# Patient Record
Sex: Female | Born: 1985 | Race: White | Hispanic: No | Marital: Married | Smoking: Former smoker
Health system: Southern US, Community
[De-identification: ages and names within clinical notes are randomized; demographics above are authoritative.]

## PROBLEM LIST (undated history)

## (undated) DIAGNOSIS — D649 Anemia, unspecified: Secondary | ICD-10-CM

## (undated) DIAGNOSIS — E0590 Thyrotoxicosis, unspecified without thyrotoxic crisis or storm: Secondary | ICD-10-CM

## (undated) HISTORY — PX: COLONOSCOPY: SHX174

---
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
## 2004-05-20 ENCOUNTER — Ambulatory Visit: Payer: Self-pay | Admitting: Family Medicine

## 2004-08-16 ENCOUNTER — Ambulatory Visit: Payer: Self-pay | Admitting: Family Medicine

## 2005-01-15 ENCOUNTER — Other Ambulatory Visit: Admission: RE | Admit: 2005-01-15 | Discharge: 2005-01-15 | Payer: Self-pay | Admitting: Obstetrics and Gynecology

## 2006-07-16 ENCOUNTER — Encounter: Admission: RE | Admit: 2006-07-16 | Discharge: 2006-07-16 | Payer: Self-pay | Admitting: Endocrinology

## 2006-07-28 ENCOUNTER — Encounter: Admission: RE | Admit: 2006-07-28 | Discharge: 2006-07-28 | Payer: Self-pay | Admitting: Endocrinology

## 2006-08-12 ENCOUNTER — Other Ambulatory Visit: Admission: RE | Admit: 2006-08-12 | Discharge: 2006-08-12 | Payer: Self-pay | Admitting: Interventional Radiology

## 2006-08-12 ENCOUNTER — Encounter: Admission: RE | Admit: 2006-08-12 | Discharge: 2006-08-12 | Payer: Self-pay | Admitting: Endocrinology

## 2006-08-12 ENCOUNTER — Encounter (INDEPENDENT_AMBULATORY_CARE_PROVIDER_SITE_OTHER): Payer: Self-pay | Admitting: Specialist

## 2007-04-08 IMAGING — US US SOFT TISSUE HEAD/NECK
1 series · 14 of 25 positions shown · non-contrast
Comparison: Nuclear medicine scan 07/17/06.

CLINICAL DATA: 20 year old with photopenic area seen on a recent thyroid scan. 
 THYROID ULTRASOUND:
TECHNIQUE: Ultrasound examination of the thyroid gland and adjacent soft tissue structures was performed.

[Series 1: unknown · 0.09mm/px · 14 of 58 slices shown]
[im 1/58]
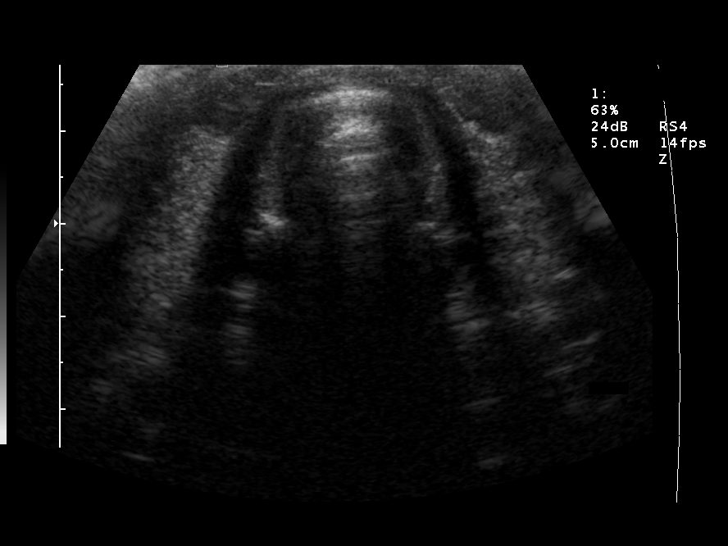
[im 5/58]
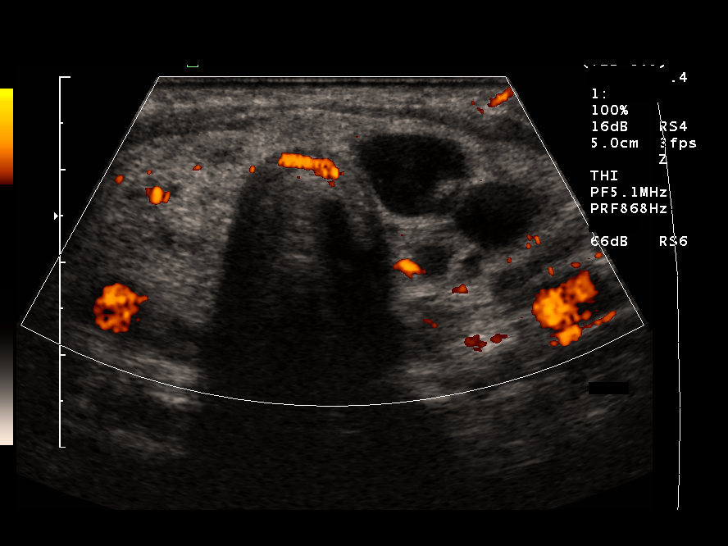
[im 10/58]
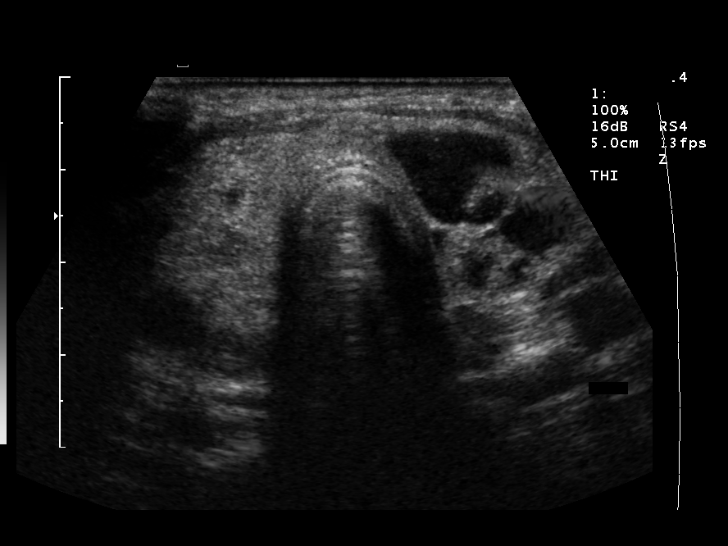
[im 15/58]
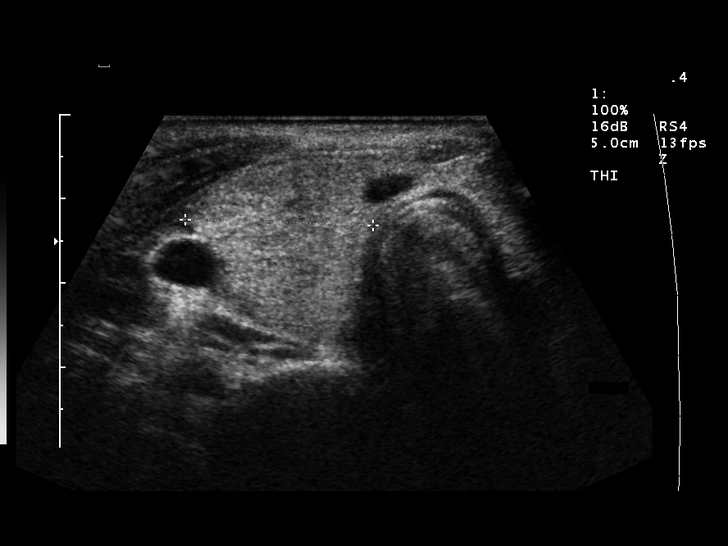
[im 20/58]
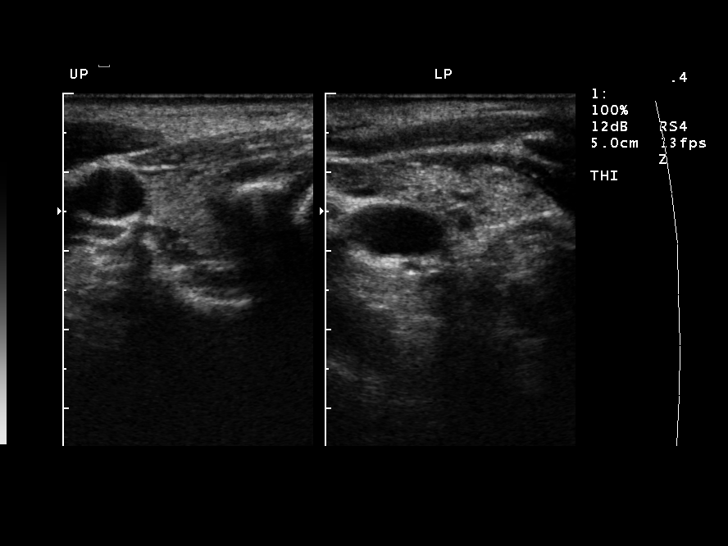
[im 22/58]
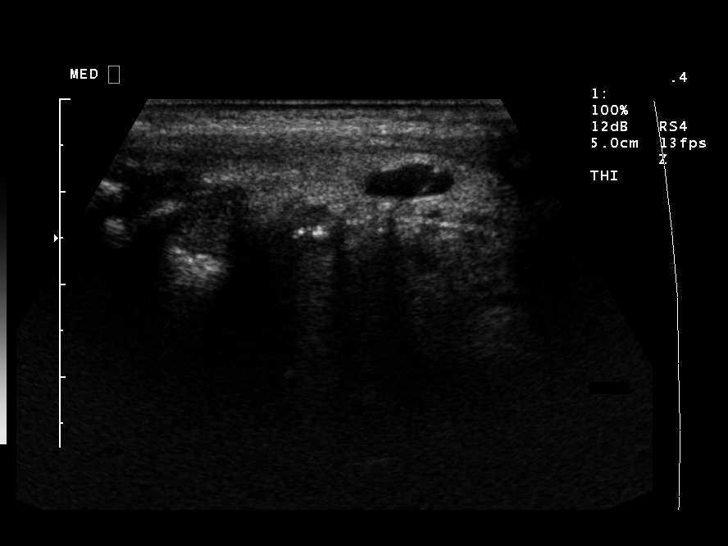
[im 27/58]
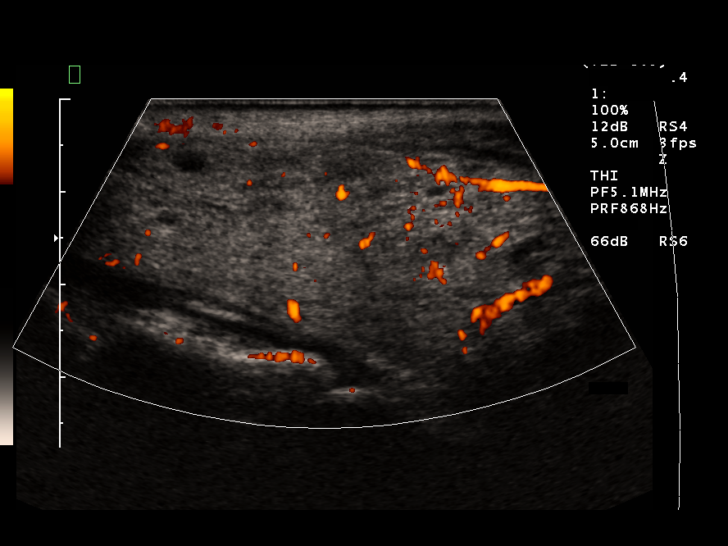
[im 31/58]
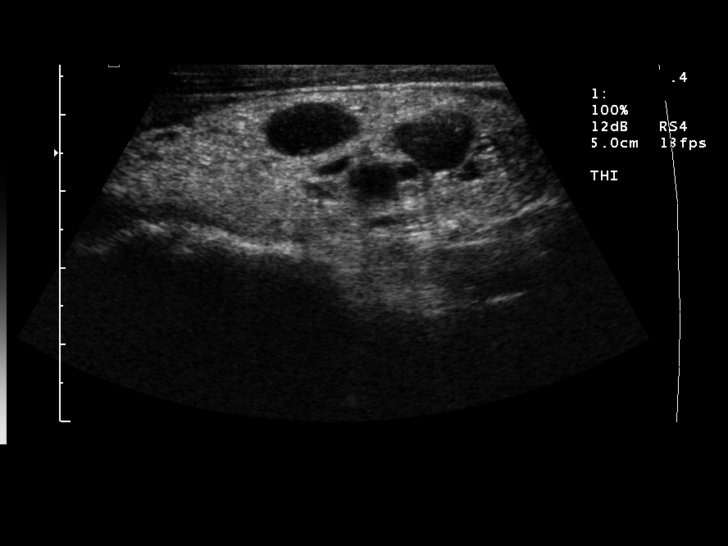
[im 36/58]
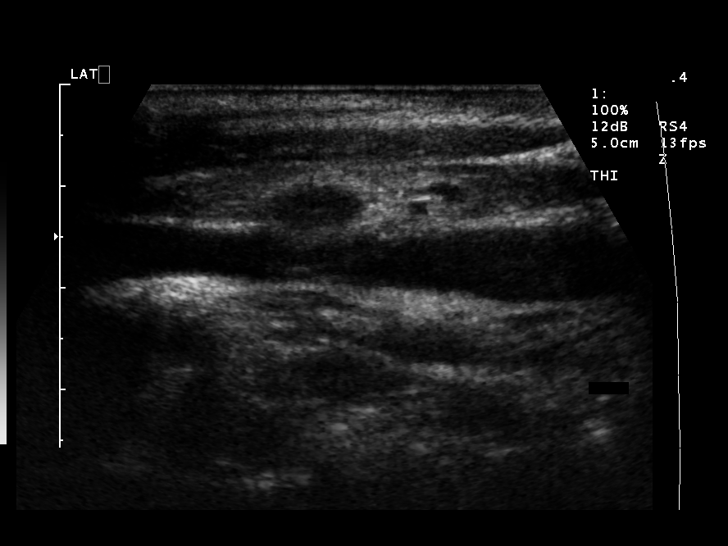
[im 39/58]
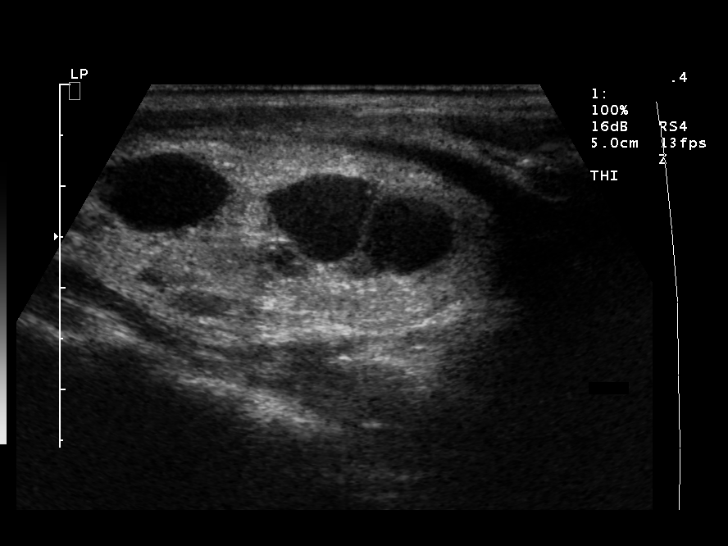
[im 43/58]
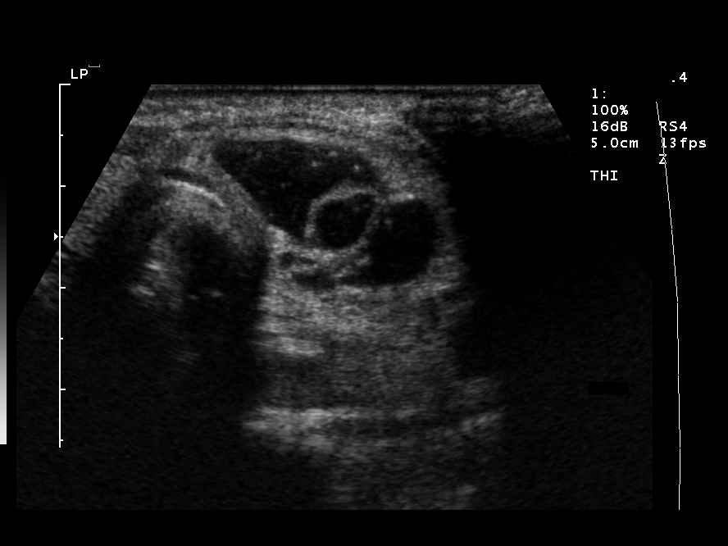
[im 48/58]
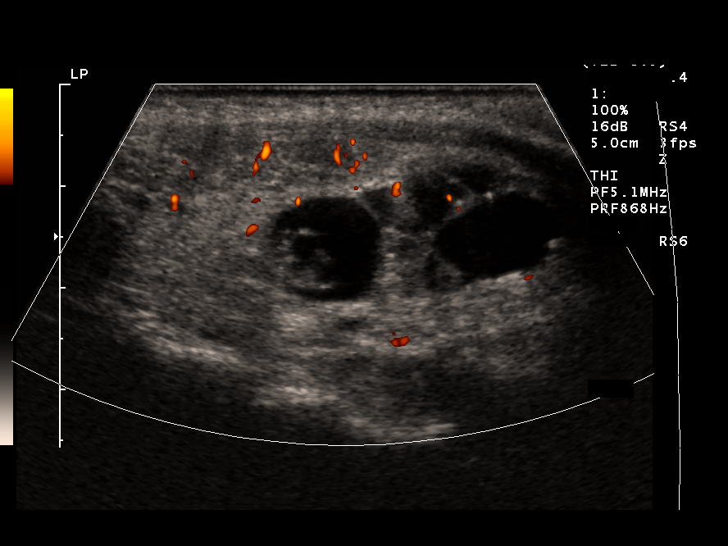
[im 53/58]
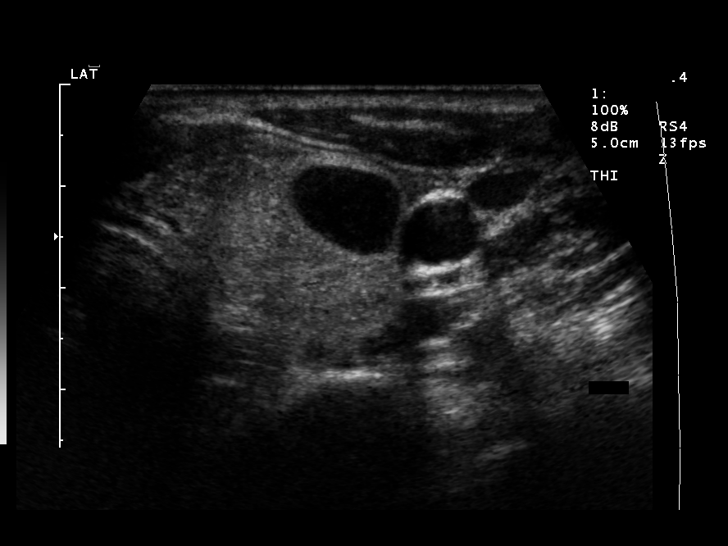
[im 58/58]
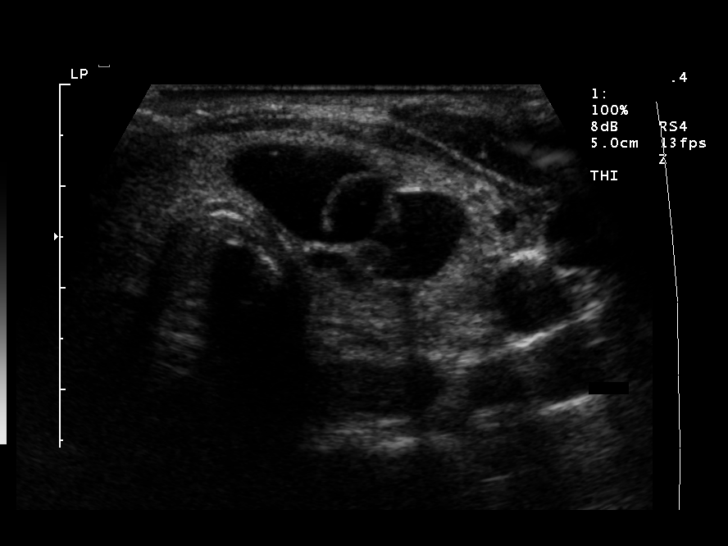

[14 of 25 positions shown; findings below may reference images not displayed]

FINDINGS: There is a complex 3.5 x 1.3 x 2.2cm largely cystic lesion in the left lower thyroid lobe accounting for the photopenic areas seen on the thyroid scan.  This needs fine needle aspiration biopsy.  There is a smaller adjacent largely cystic area measuring 1.5 x .9 x 1.1cm which needs to be followed and a largely cystic 1.0 x 0.4 x 0.7cm nodule in the right hepatic lobe near the isthmus.  
 Both lobes are enlarged.  The right measures 6.5 x 2.7 x 2.2cm.  The left measures 6.7 x 2.3 x 2.4cm.  The isthmus measures .5cm.  Echotexture is fairly heterogeneous.
IMPRESSION: 1.  Enlarged thyroid gland with multiple nodules.  There is a large complex cystic and solid area in the left lower thyroid lobe accounting for the abnormality seen on thyroid scan.  This needs fine needle aspiration biopsy.  The other two lesions can be followed depending on the pathology of this lesion.

## 2007-04-23 IMAGING — US US BIOPSY
1 series · 7 of 7 positions shown · non-contrast
Comparison: NM 07/17/06 and thyroid ultrasound 07/28/06.

CLINICAL DATA: 20 year old female with a complex partially cystic left thyroid cold nodule.  
ULTRASOUND GUIDED LEFT THYROID FNA BIOPSY:

[Series 1: us biopsy · 7 acquisitions, 7 frames shown]
[im 1/7]
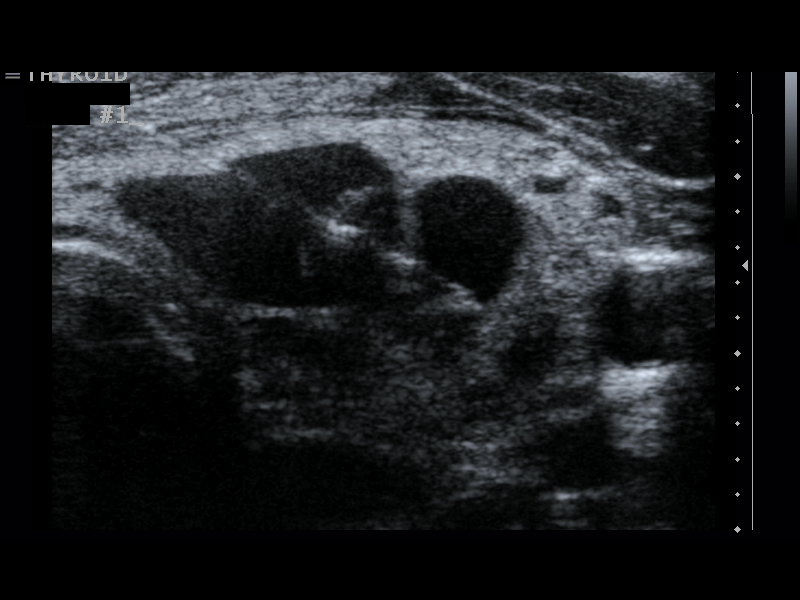
[im 2/7]
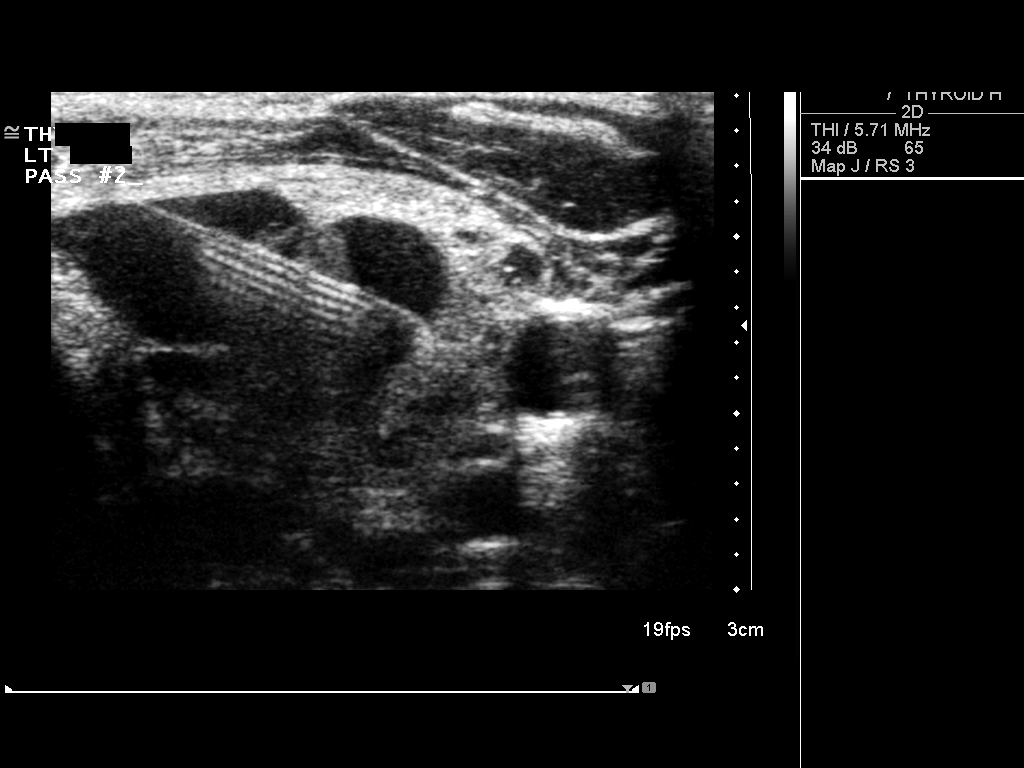
[im 3/7]
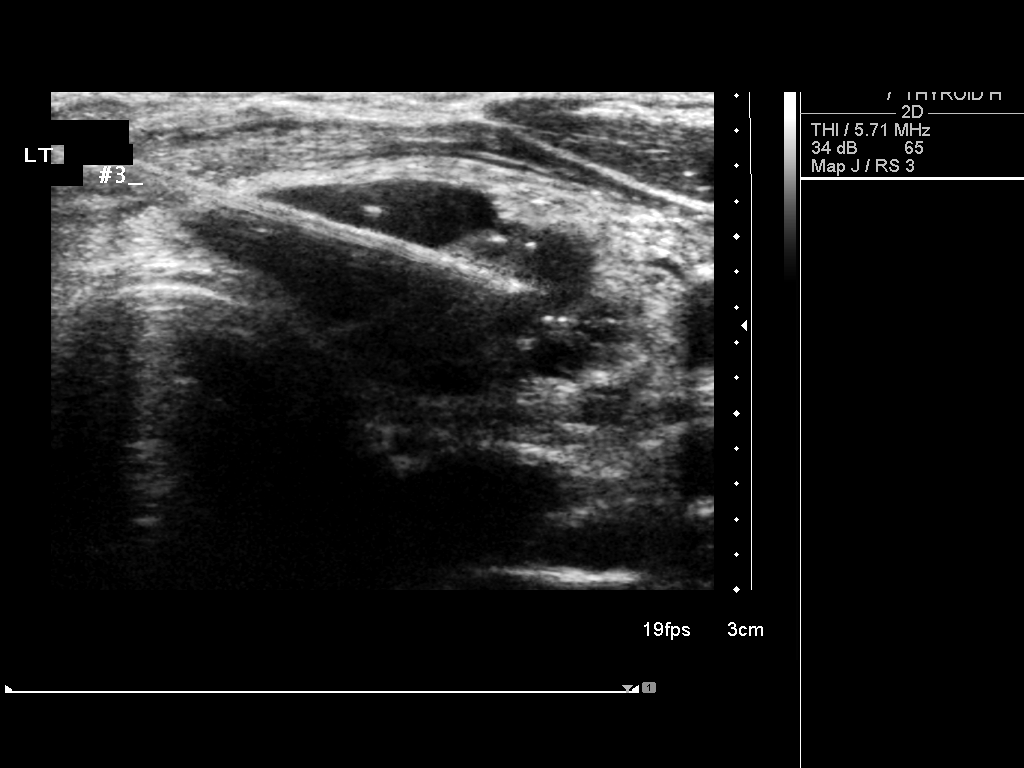
[im 4/7]
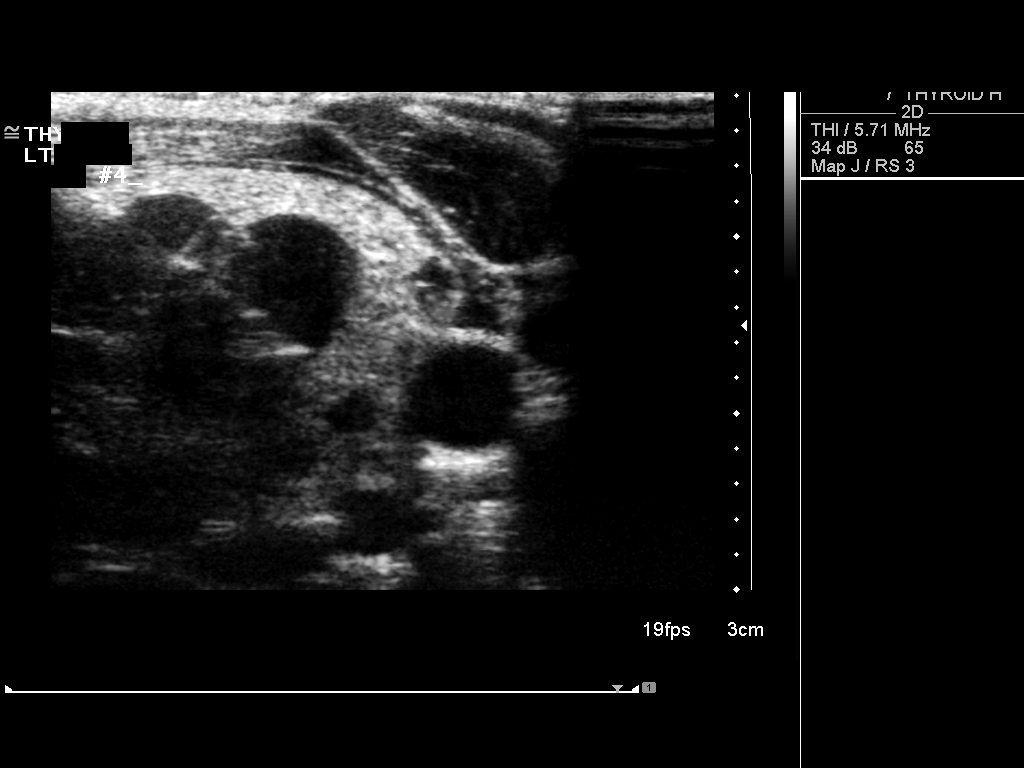
[im 5/7]
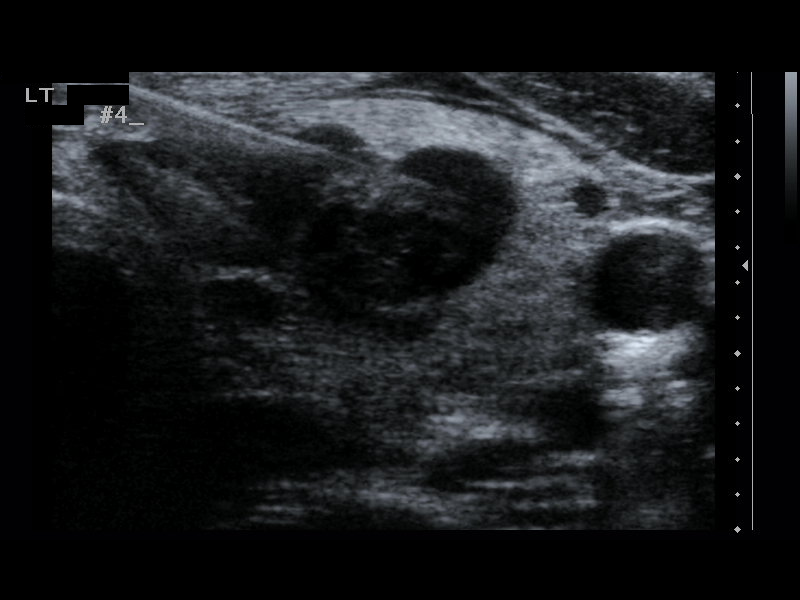
[im 6/7]
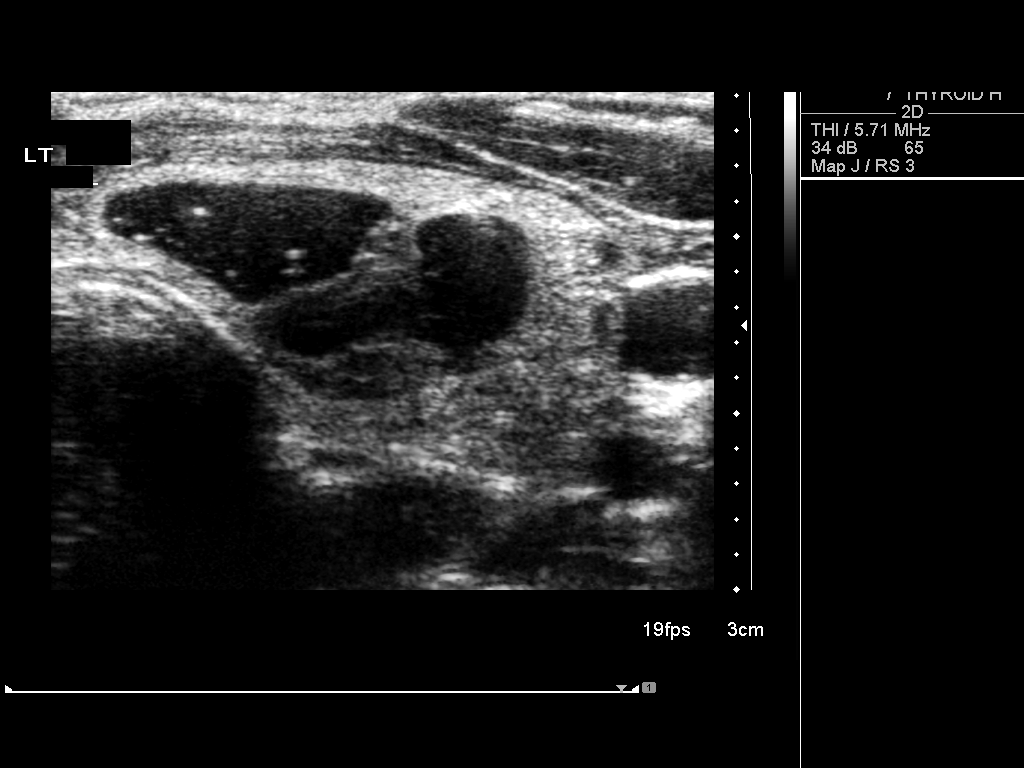
[im 7/7]
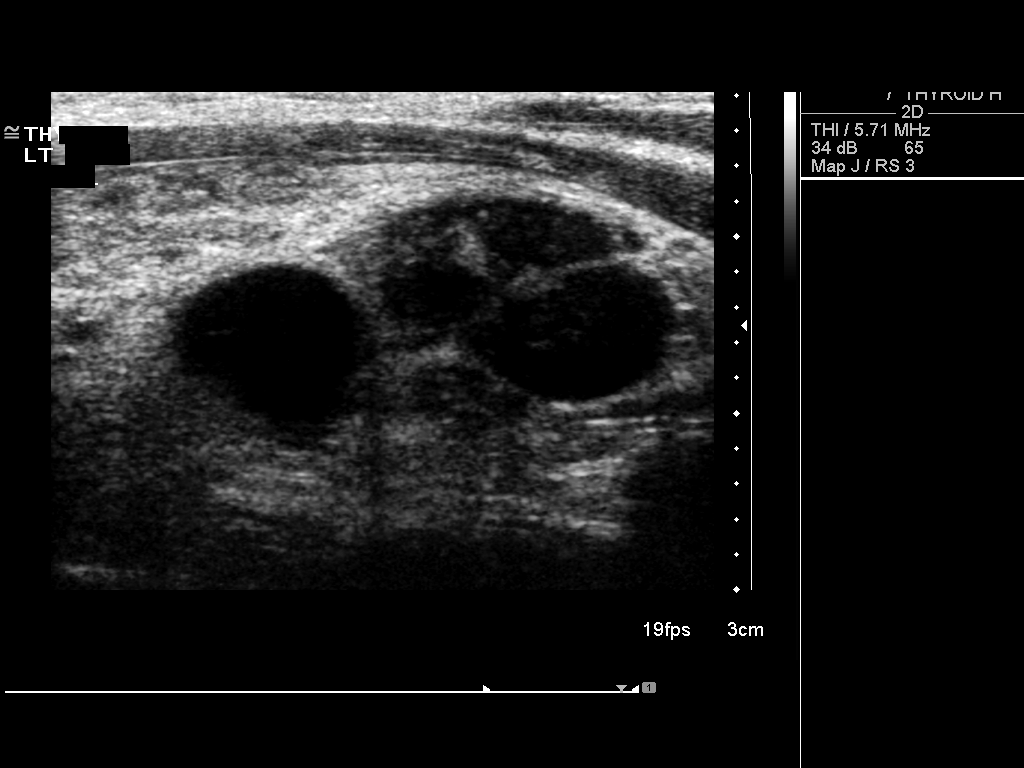

[7 of 7 positions shown; findings below may reference images not displayed]

Radiologist:  Kurt Angelo Eiman, M.D.
Guidance:  Ultrasound. 
Procedure/Findings:  Informed consent was obtained from the patient.  Preliminary ultrasound was performed of the left thyroid gland.  The complex septated cystic mass was identified and correlates with the previous ultrasound as well as the nuclear medicine scan.  
Under sterile conditions and local anesthesia, initially a 25 gauge needle was inserted.  FNA was performed.  The sample was thick and gelatinous.  Because of this, the second biopsy was performed with a 21 gauge In-rad needle.  This aspiration also yielded thick gelatinous fluid.  Additional slides were made.  The third biopsy was also performed in a similar fashion with a 21 gauge In-rad needle.  The fourth biopsy was performed with a 20 gauge spinal needle.  Needle position was confirmed within the cystic complex mass by ultrasound.  Images were obtained for documentation.  The patient tolerated the procedure well.  There were no immediate complications.  
Post procedure imaging demonstrated no evidence of hemorrhage.
IMPRESSION: 1.  Ultrasound guided left thyroid complex cystic mass FNA biopsy (four).

## 2009-01-30 ENCOUNTER — Encounter: Admission: RE | Admit: 2009-01-30 | Discharge: 2009-01-30 | Payer: Self-pay | Admitting: Surgery

## 2011-03-17 NOTE — Patient Instructions (Addendum)
   Your procedure is scheduled ZO:[id number] October 4th  Enter through the Main Entrance of [facility] at: 6am Pick up the phone at the desk and dial [phone] and inform us of your arrival  Please call this number if you have any problems the morning of surgery: [phone]  Remember: Do not eat food after midnight Wed. 10/3 Do not drink clear liquids after:midnight Wed 10/3 Take these medicines the morning of surgery with a SIP OF WATER:  none  Do not wear jewelry, make-up, or FINGER nail polish Do not wear lotions, powders, or perfumes.  You may wear deodorant. Do not shave 48 hours prior to surgery. Do not bring valuables to the hospital. Leave suitcase in the car. After Surgery it may be brought to your room. For patients being admitted to the hospital, checkout time is 11:00am the day of discharge.  Patients discharged on the day of surgery will not be allowed to drive home.   Name and phone number of your driver:  husband "Kellie Dalton"   cell [phone]   Remember to use your hibiclens as instructed.Please shower with 1/2 bottle the evening before your surgery and the other 1/2 bottle the morning of surgery.

## 2011-03-18 ENCOUNTER — Encounter (HOSPITAL_COMMUNITY): Payer: Self-pay

## 2011-03-18 ENCOUNTER — Encounter (HOSPITAL_COMMUNITY)
Admission: RE | Admit: 2011-03-18 | Discharge: 2011-03-18 | Disposition: A | Discharge status: Home / Self Care | Payer: Commercial Managed Care - PPO | Source: Ambulatory Visit | Attending: Obstetrics and Gynecology | Admitting: Obstetrics and Gynecology

## 2011-03-18 HISTORY — DX: Thyrotoxicosis, unspecified without thyrotoxic crisis or storm: E05.90

## 2011-03-18 HISTORY — DX: Anemia, unspecified: D64.9

## 2011-03-18 LAB — CBC
HCT: 39.9 % (ref 36.0–46.0)
Hemoglobin: 13.6 g/dL (ref 12.0–15.0)
MCHC: 34.1 g/dL (ref 30.0–36.0)
MCV: 88.5 fL (ref 78.0–100.0)
Platelets: 232 10*3/uL (ref 150–400)

## 2011-03-18 LAB — SURGICAL PCR SCREEN: Staphylococcus aureus: NEGATIVE

## 2011-03-18 NOTE — Pre-Procedure Instructions (Signed)
Anesthesia - ok to see patient DOS.

## 2011-03-19 NOTE — H&P (Signed)
25 year old G 0 presents for abdominal myomectomies. She has recently been having menses 14 days in length. she and her husband have also not used contraception in a long time and have been unable to conceive. She denies pelvic pain but reports a lot of bloating prior to menses. An ultrasound in the office revealed multiple uterine fibroids and possible bilateral endometriomas. She presents for abdominal myomectomies and possible treatment of endometriosis.  Medical history Unremarkable  Surgical history None  meds Progesterone  Allergies NKDA  Soc Married Positive for tobacco  fam hx Unremarkable  AFVSS Gen alert and oriented Lung CTAB Car RRR Abd soft nontender Pelvic Cervix is normal Uterus is 12 week size and myomatous IMP Symptomatic fibroids  Plan Abdominal myomectomies Possible fulguration of endometriosis Risks discussed with patient

## 2011-03-20 ENCOUNTER — Encounter (HOSPITAL_COMMUNITY): Payer: Self-pay | Admitting: Anesthesiology

## 2011-03-20 ENCOUNTER — Other Ambulatory Visit: Payer: Self-pay | Admitting: Obstetrics and Gynecology

## 2011-03-20 ENCOUNTER — Inpatient Hospital Stay (HOSPITAL_COMMUNITY)
Admission: RE | Admit: 2011-03-20 | Discharge: 2011-03-21 | DRG: 743 | Disposition: A | Discharge status: Home / Self Care | Payer: Commercial Managed Care - PPO | Source: Ambulatory Visit | Attending: Obstetrics and Gynecology | Admitting: Obstetrics and Gynecology

## 2011-03-20 ENCOUNTER — Inpatient Hospital Stay (HOSPITAL_COMMUNITY): Payer: Commercial Managed Care - PPO | Admitting: Anesthesiology

## 2011-03-20 ENCOUNTER — Encounter (HOSPITAL_COMMUNITY)
Admission: RE | Disposition: A | Discharge status: Home / Self Care | Payer: Self-pay | Source: Ambulatory Visit | Attending: Obstetrics and Gynecology

## 2011-03-20 DIAGNOSIS — Z9889 Other specified postprocedural states: Secondary | ICD-10-CM

## 2011-03-20 DIAGNOSIS — N80109 Endometriosis of ovary, unspecified side, unspecified depth: Secondary | ICD-10-CM | POA: Diagnosis present

## 2011-03-20 DIAGNOSIS — N801 Endometriosis of ovary: Secondary | ICD-10-CM | POA: Diagnosis present

## 2011-03-20 DIAGNOSIS — D259 Leiomyoma of uterus, unspecified: Principal | ICD-10-CM | POA: Diagnosis present

## 2011-03-20 HISTORY — PX: MYOMECTOMY: SHX85

## 2011-03-20 LAB — PREGNANCY, URINE: Preg Test, Ur: NEGATIVE

## 2011-03-20 SURGERY — MYOMECTOMY, ABDOMINAL APPROACH
Anesthesia: General | Site: Abdomen | Wound class: Clean Contaminated

## 2011-03-20 MED ORDER — PROPOFOL 10 MG/ML IV EMUL
INTRAVENOUS | Status: AC
  Filled 2011-03-20: qty 20

## 2011-03-20 MED ORDER — FENTANYL CITRATE 0.05 MG/ML IJ SOLN
INTRAMUSCULAR | Status: DC | PRN
  Administered 2011-03-20 (×2): 100 ug via INTRAVENOUS
  Administered 2011-03-20: 50 ug via INTRAVENOUS

## 2011-03-20 MED ORDER — ONDANSETRON HCL 4 MG/2ML IJ SOLN: 4.0000 mg | Freq: Four times a day (QID) | INTRAMUSCULAR | Status: DC | PRN

## 2011-03-20 MED ORDER — SODIUM CHLORIDE 0.9 % IJ SOLN: 9.0000 mL | INTRAMUSCULAR | Status: DC | PRN

## 2011-03-20 MED ORDER — DEXTROSE IN LACTATED RINGERS 5 % IV SOLN
INTRAVENOUS | Status: DC
  Administered 2011-03-20 – 2011-03-21 (×3): via INTRAVENOUS

## 2011-03-20 MED ORDER — HYDROMORPHONE 0.3 MG/ML IV SOLN
INTRAVENOUS | Status: DC
  Administered 2011-03-20: 11:00:00 via INTRAVENOUS

## 2011-03-20 MED ORDER — NEOSTIGMINE METHYLSULFATE 1 MG/ML IJ SOLN
INTRAMUSCULAR | Status: DC | PRN
  Administered 2011-03-20: 3 mg via INTRAVENOUS

## 2011-03-20 MED ORDER — HYDROMORPHONE HCL 1 MG/ML IJ SOLN
INTRAMUSCULAR | Status: AC
  Administered 2011-03-20: 0.25 mg via INTRAVENOUS
  Filled 2011-03-20: qty 1

## 2011-03-20 MED ORDER — ROCURONIUM BROMIDE 100 MG/10ML IV SOLN
INTRAVENOUS | Status: DC | PRN
  Administered 2011-03-20: 30 mg via INTRAVENOUS

## 2011-03-20 MED ORDER — DIPHENHYDRAMINE HCL 12.5 MG/5ML PO ELIX
12.5000 mg | ORAL_SOLUTION | Freq: Four times a day (QID) | ORAL | Status: DC | PRN
  Filled 2011-03-20: qty 5

## 2011-03-20 MED ORDER — ROCURONIUM BROMIDE 50 MG/5ML IV SOLN
INTRAVENOUS | Status: AC
  Filled 2011-03-20: qty 1

## 2011-03-20 MED ORDER — DEXAMETHASONE SODIUM PHOSPHATE 4 MG/ML IJ SOLN
INTRAMUSCULAR | Status: DC | PRN
  Administered 2011-03-20: 10 mg via INTRAVENOUS

## 2011-03-20 MED ORDER — ONDANSETRON HCL 4 MG/2ML IJ SOLN
INTRAMUSCULAR | Status: AC
  Filled 2011-03-20: qty 2

## 2011-03-20 MED ORDER — NEOSTIGMINE METHYLSULFATE 1 MG/ML IJ SOLN
INTRAMUSCULAR | Status: AC
  Filled 2011-03-20: qty 10

## 2011-03-20 MED ORDER — METOCLOPRAMIDE HCL 5 MG/ML IJ SOLN: 10.0000 mg | Freq: Once | INTRAMUSCULAR | Status: DC | PRN

## 2011-03-20 MED ORDER — DIPHENHYDRAMINE HCL 50 MG/ML IJ SOLN: 12.5000 mg | Freq: Four times a day (QID) | INTRAMUSCULAR | Status: DC | PRN

## 2011-03-20 MED ORDER — HYDROMORPHONE HCL 1 MG/ML IJ SOLN
INTRAMUSCULAR | Status: AC
  Filled 2011-03-20: qty 1

## 2011-03-20 MED ORDER — MIDAZOLAM HCL 2 MG/2ML IJ SOLN
INTRAMUSCULAR | Status: AC
  Filled 2011-03-20: qty 2

## 2011-03-20 MED ORDER — LIDOCAINE HCL (CARDIAC) 20 MG/ML IV SOLN
INTRAVENOUS | Status: AC
  Filled 2011-03-20: qty 5

## 2011-03-20 MED ORDER — CEFAZOLIN SODIUM 1-5 GM-% IV SOLN
INTRAVENOUS | Status: AC
  Administered 2011-03-20: 1 g via INTRAVENOUS
  Filled 2011-03-20: qty 50

## 2011-03-20 MED ORDER — LACTATED RINGERS IV SOLN
INTRAVENOUS | Status: DC
  Administered 2011-03-20 (×2): via INTRAVENOUS

## 2011-03-20 MED ORDER — HYDROMORPHONE 0.3 MG/ML IV SOLN: INTRAVENOUS | Status: DC

## 2011-03-20 MED ORDER — ONDANSETRON HCL 4 MG/2ML IJ SOLN
INTRAMUSCULAR | Status: DC | PRN
  Administered 2011-03-20: 4 mg via INTRAVENOUS

## 2011-03-20 MED ORDER — PROPOFOL 10 MG/ML IV EMUL
INTRAVENOUS | Status: DC | PRN
  Administered 2011-03-20: 200 mg via INTRAVENOUS

## 2011-03-20 MED ORDER — HYDROMORPHONE HCL 1 MG/ML IJ SOLN
0.2500 mg | INTRAMUSCULAR | Status: DC | PRN
  Administered 2011-03-20: 0.25 mg via INTRAVENOUS

## 2011-03-20 MED ORDER — MIDAZOLAM HCL 5 MG/5ML IJ SOLN
INTRAMUSCULAR | Status: DC | PRN
  Administered 2011-03-20: 2 mg via INTRAVENOUS

## 2011-03-20 MED ORDER — NALOXONE HCL 0.4 MG/ML IJ SOLN: 0.4000 mg | INTRAMUSCULAR | Status: DC | PRN

## 2011-03-20 MED ORDER — DIPHENHYDRAMINE HCL 12.5 MG/5ML PO ELIX: 12.5000 mg | ORAL_SOLUTION | Freq: Four times a day (QID) | ORAL | Status: DC | PRN

## 2011-03-20 MED ORDER — CEFAZOLIN SODIUM 1-5 GM-% IV SOLN: 1.0000 g | INTRAVENOUS | Status: DC

## 2011-03-20 MED ORDER — HYDROMORPHONE 0.3 MG/ML IV SOLN
INTRAVENOUS | Status: AC
  Filled 2011-03-20: qty 25

## 2011-03-20 MED ORDER — PROMETHAZINE HCL 25 MG/ML IJ SOLN: 12.5000 mg | INTRAMUSCULAR | Status: DC | PRN

## 2011-03-20 MED ORDER — GLYCOPYRROLATE 0.2 MG/ML IJ SOLN
INTRAMUSCULAR | Status: DC | PRN
  Administered 2011-03-20: .4 mg via INTRAVENOUS

## 2011-03-20 MED ORDER — LIDOCAINE HCL (CARDIAC) 20 MG/ML IV SOLN
INTRAVENOUS | Status: DC | PRN
  Administered 2011-03-20: 80 mg via INTRAVENOUS

## 2011-03-20 MED ORDER — FENTANYL CITRATE 0.05 MG/ML IJ SOLN
INTRAMUSCULAR | Status: AC
  Filled 2011-03-20: qty 5

## 2011-03-20 MED ORDER — HYDROMORPHONE 0.3 MG/ML IV SOLN
INTRAVENOUS | Status: DC
  Administered 2011-03-20: 1.5 mg via INTRAVENOUS
  Administered 2011-03-20: 5.33 mg via INTRAVENOUS
  Administered 2011-03-21: 0.3 mg via INTRAVENOUS
  Administered 2011-03-21: 0.6 mg via INTRAVENOUS

## 2011-03-20 MED ORDER — TRAMADOL HCL 50 MG PO TABS: 50.0000 mg | ORAL_TABLET | Freq: Four times a day (QID) | ORAL | Status: DC | PRN

## 2011-03-20 MED ORDER — HYDROMORPHONE HCL 1 MG/ML IJ SOLN
INTRAMUSCULAR | Status: DC | PRN
  Administered 2011-03-20: 2 mg via INTRAVENOUS

## 2011-03-20 MED ORDER — GLYCOPYRROLATE 0.2 MG/ML IJ SOLN
INTRAMUSCULAR | Status: AC
  Filled 2011-03-20: qty 1

## 2011-03-20 SURGICAL SUPPLY — 33 items
ADH SKN CLS APL DERMABOND .7 (GAUZE/BANDAGES/DRESSINGS) ×1
BARRIER ADHS 3X4 INTERCEED (GAUZE/BANDAGES/DRESSINGS) ×2 IMPLANT
BRR ADH 4X3 ABS CNTRL BYND (GAUZE/BANDAGES/DRESSINGS) ×2
CANISTER SUCTION 2500CC (MISCELLANEOUS) ×2 IMPLANT
CHLORAPREP W/TINT 26ML (MISCELLANEOUS) ×2 IMPLANT
CLOTH BEACON ORANGE TIMEOUT ST (SAFETY) ×2 IMPLANT
CONT PATH 16OZ SNAP LID 3702 (MISCELLANEOUS) ×2 IMPLANT
DECANTER SPIKE VIAL GLASS SM (MISCELLANEOUS) ×2 IMPLANT
DERMABOND ADVANCED (GAUZE/BANDAGES/DRESSINGS) ×1
DERMABOND ADVANCED .7 DNX12 (GAUZE/BANDAGES/DRESSINGS) IMPLANT
GLOVE BIO SURGEON STRL SZ 6.5 (GLOVE) ×4 IMPLANT
GOWN PREVENTION PLUS LG XLONG (DISPOSABLE) ×6 IMPLANT
NDL HYPO 21X1.5 SAFETY (NEEDLE) IMPLANT
NDL HYPO 25X1 1.5 SAFETY (NEEDLE) ×1 IMPLANT
NEEDLE HYPO 21X1.5 SAFETY (NEEDLE) IMPLANT
NEEDLE HYPO 22GX1.5 SAFETY (NEEDLE) ×2 IMPLANT
NEEDLE HYPO 25X1 1.5 SAFETY (NEEDLE) ×2 IMPLANT
NS IRRIG 1000ML POUR BTL (IV SOLUTION) ×2 IMPLANT
PACK ABDOMINAL GYN (CUSTOM PROCEDURE TRAY) ×2 IMPLANT
PAD OB MATERNITY 4.3X12.25 (PERSONAL CARE ITEMS) ×2 IMPLANT
STAPLER VISISTAT 35W (STAPLE) ×2 IMPLANT
SUT PDS AB 0 CTX 60 (SUTURE) IMPLANT
SUT VIC AB 0 CT1 27 (SUTURE) ×8
SUT VIC AB 0 CT1 27XBRD ANBCTR (SUTURE) ×4 IMPLANT
SUT VIC AB 2-0 CT1 27 (SUTURE) ×2
SUT VIC AB 2-0 CT1 TAPERPNT 27 (SUTURE) ×1 IMPLANT
SUT VIC AB 3-0 CT1 27 (SUTURE) ×6
SUT VIC AB 3-0 CT1 TAPERPNT 27 (SUTURE) ×3 IMPLANT
SYR CONTROL 10ML LL (SYRINGE) ×2 IMPLANT
SYRINGE 10CC LL (SYRINGE) ×2 IMPLANT
TOWEL OR 17X24 6PK STRL BLUE (TOWEL DISPOSABLE) ×4 IMPLANT
TRAY FOLEY CATH 14FR (SET/KITS/TRAYS/PACK) ×2 IMPLANT
WATER STERILE IRR 1000ML POUR (IV SOLUTION) ×2 IMPLANT

## 2011-03-20 NOTE — Progress Notes (Signed)
Pre op Diagnosis: Symptomatic Fibroids  Post Op Diagnosis: Same Severe Endometriosis involoving both ovaries  Procedure: Abdominal Myomectomies Fulguration of Endometriosis  Surgeon: Marcelle Overlie, MD  Anesthesia: General  EBL: 200  Findings: 1. 9 fibroids removed 2. Fallopian tubes appeared healthy 3. Multiple endometriotic implants on both ovaries, filmy adhesions around right ovary  Comps None  Drains Foley  Pathology  Fibroids sent to pathology  Dictation number: [id number]

## 2011-03-20 NOTE — Anesthesia Postprocedure Evaluation (Signed)
  Anesthesia Post-op Note  Patient: Kellie Dalton  Procedure(s) Performed:  MYOMECTOMY  Patient Location: PACU  Anesthesia Type: General  Level of Consciousness: awake, alert  and oriented  Airway and Oxygen Therapy: Patient Spontanous Breathing  Post-op Pain: mild  Post-op Assessment: Post-op Vital signs reviewed, Patient's Cardiovascular Status Stable, Respiratory Function Stable, Patent Airway, No signs of Nausea or vomiting and Pain level controlled  Post-op Vital Signs: Reviewed and stable  Complications: No apparent anesthesia complications

## 2011-03-20 NOTE — Transfer of Care (Signed)
Immediate Anesthesia Transfer of Care Note  Patient: Kellie Dalton  Procedure(s) Performed:  MYOMECTOMY  Patient Location: PACU  Anesthesia Type: General  Level of Consciousness: awake, alert  and oriented  Airway & Oxygen Therapy: Patient Spontanous Breathing and Patient connected to nasal cannula oxygen  Post-op Assessment: Report given to PACU RN  Post vital signs: stable  Complications: No apparent anesthesia complications

## 2011-03-20 NOTE — Progress Notes (Signed)
H and P on chart  No significant changes Will proceed with Abdominal myomectomies

## 2011-03-20 NOTE — Op Note (Signed)
NAMEKAITLAND, LEWELLYN                ACCOUNT NO.:  [id number]  MEDICAL RECORD NO.:  [id number]  LOCATION:  [facility]                          FACILITY:  [facility]  PHYSICIAN:  Xinyi Batton L. Aliya Sol, M.D.DATE OF BIRTH:  October 05, 1985  DATE OF PROCEDURE: DATE OF DISCHARGE:                              OPERATIVE REPORT   PREOPERATIVE DIAGNOSIS:  Symptomatic fibroids.  POSTOPERATIVE DIAGNOSES: 1. Symptomatic fibroids. 2. Severe endometriosis involving both ovaries.  PROCEDURES: 1. Laparotomy. 2. Abdominal myomectomies. 3. Fulguration of endometriosis.  SURGEON:  Coreon Simkins L. Vincente Poli, MD  ANESTHESIA:  General.  ESTIMATED BLOOD LOSS:  200 mL.  FINDINGS: 1. Nine fibroids removed ranging in different sizes, all sent to     pathology. 2. Fallopian tubes appeared healthy. 3. Deep endometriotic implants noted on both ovaries with some filmy     adhesions around the right ovary.  PATHOLOGY:  Fibroid sent to pathology.  DRAINS:  Foley.  COMPLICATIONS:  None.  DESCRIPTION OF PROCEDURE:  The patient was taken to the operating room. Her anesthesia was administered.  She was prepped and draped in usual sterile fashion.  A Foley catheter was inserted.  A small low-transverse incision was made, carried down to the fascia.  Fascia scored in the midline, extended laterally.  Rectus muscles were separated in the midline.  The peritoneum was entered bluntly.  The peritoneal incision was then stretched.  The abdomen and lower pelvis was explored.  The uterus was noted to be myomatous and it was easily exteriorized.  When we did, we noticed that there were some chocolate brown endometriotic implants on both ovaries, on the right ovary kind of posteriorly with some filmy adhesions involving that, I easily took the filmy adhesions down and on the left ovary, there were numerous implants on the posterior wall of the ovary and between the peritoneum and the uterus. We decided to first initiate the myomectomy.   The largest fibroid reluctant to come from the posterior wall of the uterus and I decided to stay away from the fallopian tubes and I made an incision in the back wall of the uterus.  I was then able to remove the largest fibroid first which was maybe about 5 cm in diameter.  We were then able to remove a few of the other ones through that same incision.  We then closed in layers using 0 Vicryl and then 2-0 Vicryl and 3-0 Vicryl in a running locked layers.  I then had to make a few other smaller incisions about 3 in total to remove the remainder of the fibroids at each incision, but small most for superficial except for the big one in the back wall of the uterus and we totally avoided the fallopian tube.  Of course, this patient in the future will need to have a cesarean section for delivery. We did close each layer with 0 Vicryl and then 3-0 Vicryl for the serosa.  Hemostasis was excellent.  We then turned our attention to the ovaries.  We were on the right ovary and then on the left eye.  I was able to use the Bovie and then electrocoagulated and fulgurated the endometriotic implant.  I do think she  will need infertility consultation because of the endometriosis and I will arrange this as a postop after she has healed from the surgery.  Intercede, 2 large pieces of Intercede were used to kind of wrap the front and the back wall of the uterus and the biggest incision was in the back wall of the uterus. Hemostasis was excellent.  We carefully put the uterus back into the abdomen.  Irrigation had been performed.  Intercede remained on the uterus.  The peritoneum was closed using 0 Vicryl, the fascia was closed using 0 Vicryl running stitch, the skin was closed with a 4-0 Vicryl Mellody Dance needle.  Dermabond was applied.  All sponge, lap, and instrument counts were correct x2.  The patient went to recovery room in stable condition.     Manuelito Poage L. Vincente Poli, M.D.     Florestine Avers  D:   03/20/2011  T:  03/20/2011  Job:  [id number]

## 2011-03-20 NOTE — Progress Notes (Signed)
UR Chart review completed.  

## 2011-03-20 NOTE — Anesthesia Preprocedure Evaluation (Addendum)
Anesthesia Evaluation  Name, MR# and DOB Patient awake  General Assessment Comment  Reviewed: Allergy & Precautions, H&P , NPO status , Patient's Chart, lab work & pertinent test results, reviewed documented beta blocker date and time   History of Anesthesia Complications Negative for: history of anesthetic complications  Airway Mallampati: I TM Distance: >3 FB Neck ROM: full    Dental  (+) Teeth Intact   Pulmonary Recent URI  (dry cough, stuffy nose, no fever - resolving) Current Smoker  clear to auscultation  Pulmonary exam normal       Cardiovascular Exercise Tolerance: Good regular Normal    Neuro/Psych Negative Neurological ROS  Negative Psych ROS   GI/Hepatic negative GI ROS Neg liver ROS    Endo/Other  Negative Endocrine ROSHyperthyroidism (borderline - no treatment)   Renal/GU negative Renal ROS     Musculoskeletal   Abdominal   Peds  Hematology negative hematology ROS (+)   Anesthesia Other Findings   Reproductive/Obstetrics negative OB ROS                          Anesthesia Physical Anesthesia Plan  ASA: II  Anesthesia Plan: General   Post-op Pain Management:    Induction: Intravenous  Airway Management Planned:   Additional Equipment:   Intra-op Plan:   Post-operative Plan: Extubation in OR  Informed Consent: I have reviewed the patients History and Physical, chart, labs and discussed the procedure including the risks, benefits and alternatives for the proposed anesthesia with the patient or authorized representative who has indicated his/her understanding and acceptance.   Dental Advisory Given  Plan Discussed with: CRNA, Surgeon and Anesthesiologist  Anesthesia Plan Comments:        Anesthesia Quick Evaluation

## 2011-03-20 NOTE — Progress Notes (Signed)
Encounter addended by: Marrion Coy on: 03/20/2011  4:04 PM<BR>     Documentation filed: Notes Section

## 2011-03-20 NOTE — Anesthesia Postprocedure Evaluation (Signed)
  Anesthesia Post-op Note  Patient: Kellie Dalton  Procedure(s) Performed:  MYOMECTOMY  Patient Location: PACU and Women's Unit  Anesthesia Type: General  Level of Consciousness: awake, alert , oriented and patient cooperative  Airway and Oxygen Therapy: Patient Spontanous Breathing  Post-op Pain: mild  Post-op Assessment: Post-op Vital signs reviewed, Patient's Cardiovascular Status Stable and Pain level controlled  Post-op Vital Signs: Reviewed and stable  Complications: No apparent anesthesia complications

## 2011-03-21 MED ORDER — IBUPROFEN 600 MG PO TABS
600.0000 mg | ORAL_TABLET | Freq: Four times a day (QID) | ORAL | Status: DC | PRN
  Administered 2011-03-21: 600 mg via ORAL
  Filled 2011-03-21: qty 1

## 2011-03-21 MED ORDER — OXYCODONE-ACETAMINOPHEN 5-325 MG PO TABS: 2.0000 | ORAL_TABLET | ORAL | Status: AC | PRN

## 2011-03-21 MED ORDER — OXYCODONE-ACETAMINOPHEN 5-325 MG PO TABS
2.0000 | ORAL_TABLET | ORAL | Status: DC | PRN
  Administered 2011-03-21: 2 via ORAL
  Filled 2011-03-21: qty 2

## 2011-03-21 MED ORDER — IBUPROFEN 600 MG PO TABS: 600.0000 mg | ORAL_TABLET | Freq: Four times a day (QID) | ORAL | Status: AC | PRN

## 2011-03-21 NOTE — Progress Notes (Signed)
1 Day Post-Op Procedure(s): MYOMECTOMY  Subjective: Patient reports incisional pain and tolerating PO.    Objective: I have reviewed patient's vital signs, intake and output and medications.  Resp: clear to auscultation bilaterally Cardio: regular rate and rhythm, S1, S2 normal, no murmur, click, rub or gallop abdomen is soft , nontender Incision is clean, dry and intact Assessment: s/p Procedure(s): MYOMECTOMY: progressing well and tolerating diet  Plan: Advance diet Encourage ambulation Advance to PO medication Discontinue IV fluids  LOS: 1 day    Rohil Lesch L 03/21/2011, 9:19 AM

## 2011-03-21 NOTE — Discharge Summary (Signed)
Admission DIagnosis: Symptomatic Fibroids  DIscharge Diagnosis: Same Severe Endometriosis  Procedure: Abdominal Myomectomies  Patient underwent abdominal myomectomies and fulguration of endometriosis on day of admission. She did very well post op and on pod #1 was ambulating, voiding, tolerating pos and had stable vital signs. She went home on POD #1  She was discharged with ibuprofen and percocet She will followup with Dr Vincente Poli next Thursday. She was advised no driving for 1 week

## 2011-03-24 ENCOUNTER — Encounter (HOSPITAL_COMMUNITY): Payer: Self-pay | Admitting: Obstetrics and Gynecology

## 2012-01-13 LAB — OB RESULTS CONSOLE ANTIBODY SCREEN: Antibody Screen: NEGATIVE

## 2012-01-13 LAB — OB RESULTS CONSOLE HEPATITIS B SURFACE ANTIGEN: Hepatitis B Surface Ag: NEGATIVE

## 2012-01-13 LAB — OB RESULTS CONSOLE ABO/RH: RH Type: POSITIVE

## 2012-01-13 LAB — OB RESULTS CONSOLE RUBELLA ANTIBODY, IGM: Rubella: IMMUNE

## 2012-01-27 ENCOUNTER — Other Ambulatory Visit: Payer: Self-pay | Admitting: Obstetrics and Gynecology

## 2012-07-27 ENCOUNTER — Encounter (HOSPITAL_COMMUNITY): Payer: Self-pay

## 2012-08-04 ENCOUNTER — Encounter (HOSPITAL_COMMUNITY): Payer: Self-pay

## 2012-08-05 ENCOUNTER — Encounter (HOSPITAL_COMMUNITY): Payer: Self-pay

## 2012-08-05 ENCOUNTER — Encounter (HOSPITAL_COMMUNITY)
Admission: RE | Admit: 2012-08-05 | Discharge: 2012-08-05 | Disposition: A | Discharge status: Home / Self Care | Payer: Commercial Managed Care - PPO | Source: Ambulatory Visit | Attending: Obstetrics and Gynecology | Admitting: Obstetrics and Gynecology

## 2012-08-05 LAB — TYPE AND SCREEN
ABO/RH(D): O POS
Antibody Screen: NEGATIVE

## 2012-08-05 LAB — CBC
HCT: 36 % (ref 36.0–46.0)
MCHC: 33.3 g/dL (ref 30.0–36.0)
MCV: 92.8 fL (ref 78.0–100.0)
Platelets: 205 10*3/uL (ref 150–400)
RDW: 13.5 % (ref 11.5–15.5)
WBC: 9.2 10*3/uL (ref 4.0–10.5)

## 2012-08-05 NOTE — Patient Instructions (Addendum)
Your procedure is scheduled on:08/09/12  Enter through the Main Entrance at :1130 am Pick up desk phone and dial [phone] and inform us of your arrival.  Please call [phone] if you have any problems the morning of surgery.  Remember: Do not eat after midnight:Sunday Clear liquids ok until 9am on Monday  Take these meds the morning of surgery with a sip of water: none  DO NOT wear jewelry, eye make-up, lipstick,body lotion, or dark fingernail polish. Do not shave for 48 hours prior to surgery.  If you are to be admitted after surgery, leave suitcase in car until your room has been assigned. Patients discharged on the day of surgery will not be allowed to drive home.

## 2012-08-09 ENCOUNTER — Inpatient Hospital Stay (HOSPITAL_COMMUNITY)
Admission: RE | Admit: 2012-08-09 | Discharge: 2012-08-11 | DRG: 766 | Disposition: A | Discharge status: Home / Self Care | Payer: Commercial Managed Care - PPO | Source: Ambulatory Visit | Attending: Obstetrics and Gynecology | Admitting: Obstetrics and Gynecology

## 2012-08-09 ENCOUNTER — Inpatient Hospital Stay (HOSPITAL_COMMUNITY): Payer: Commercial Managed Care - PPO | Admitting: Anesthesiology

## 2012-08-09 ENCOUNTER — Encounter (HOSPITAL_COMMUNITY): Payer: Self-pay | Admitting: Anesthesiology

## 2012-08-09 ENCOUNTER — Encounter (HOSPITAL_COMMUNITY)
Admission: RE | Disposition: A | Discharge status: Home / Self Care | Payer: Self-pay | Source: Ambulatory Visit | Attending: Obstetrics and Gynecology

## 2012-08-09 ENCOUNTER — Encounter (HOSPITAL_COMMUNITY): Payer: Self-pay | Admitting: *Deleted

## 2012-08-09 DIAGNOSIS — O3490 Maternal care for abnormality of pelvic organ, unspecified, unspecified trimester: Principal | ICD-10-CM | POA: Diagnosis present

## 2012-08-09 LAB — TYPE AND SCREEN
ABO/RH(D): O POS
Antibody Screen: NEGATIVE

## 2012-08-09 SURGERY — Surgical Case
Anesthesia: Spinal | Wound class: Clean Contaminated

## 2012-08-09 MED ORDER — CEFAZOLIN SODIUM-DEXTROSE 2-3 GM-% IV SOLR
INTRAVENOUS | Status: AC
  Filled 2012-08-09: qty 50

## 2012-08-09 MED ORDER — SCOPOLAMINE 1 MG/3DAYS TD PT72
MEDICATED_PATCH | TRANSDERMAL | Status: AC
  Administered 2012-08-09: 1.5 mg via TRANSDERMAL
  Filled 2012-08-09: qty 1

## 2012-08-09 MED ORDER — ONDANSETRON HCL 4 MG PO TABS: 4.0000 mg | ORAL_TABLET | ORAL | Status: DC | PRN

## 2012-08-09 MED ORDER — OXYTOCIN 10 UNIT/ML IJ SOLN
40.0000 [IU] | INTRAVENOUS | Status: DC | PRN
  Administered 2012-08-09: 40 [IU] via INTRAVENOUS

## 2012-08-09 MED ORDER — ONDANSETRON HCL 4 MG/2ML IJ SOLN
INTRAMUSCULAR | Status: AC
  Filled 2012-08-09: qty 2

## 2012-08-09 MED ORDER — DIBUCAINE 1 % RE OINT: 1.0000 "application " | TOPICAL_OINTMENT | RECTAL | Status: DC | PRN

## 2012-08-09 MED ORDER — FENTANYL CITRATE 0.05 MG/ML IJ SOLN
INTRAMUSCULAR | Status: DC | PRN
Start: 2012-08-09 — End: 2012-08-09
  Administered 2012-08-09: 25 ug via INTRATHECAL

## 2012-08-09 MED ORDER — LANOLIN HYDROUS EX OINT: 1.0000 "application " | TOPICAL_OINTMENT | CUTANEOUS | Status: DC | PRN

## 2012-08-09 MED ORDER — METOCLOPRAMIDE HCL 5 MG/ML IJ SOLN: 10.0000 mg | Freq: Three times a day (TID) | INTRAMUSCULAR | Status: DC | PRN

## 2012-08-09 MED ORDER — KETOROLAC TROMETHAMINE 30 MG/ML IJ SOLN: 30.0000 mg | Freq: Four times a day (QID) | INTRAMUSCULAR | Status: AC | PRN

## 2012-08-09 MED ORDER — ACETAMINOPHEN 10 MG/ML IV SOLN: 1000.0000 mg | Freq: Four times a day (QID) | INTRAVENOUS | Status: AC | PRN

## 2012-08-09 MED ORDER — MEPERIDINE HCL 25 MG/ML IJ SOLN: 6.2500 mg | INTRAMUSCULAR | Status: DC | PRN

## 2012-08-09 MED ORDER — WITCH HAZEL-GLYCERIN EX PADS: 1.0000 "application " | MEDICATED_PAD | CUTANEOUS | Status: DC | PRN

## 2012-08-09 MED ORDER — BUPIVACAINE IN DEXTROSE 0.75-8.25 % IT SOLN
INTRATHECAL | Status: DC | PRN
  Administered 2012-08-09: 12 mg via INTRATHECAL

## 2012-08-09 MED ORDER — PRENATAL MULTIVITAMIN CH
1.0000 | ORAL_TABLET | Freq: Every day | ORAL | Status: DC
  Administered 2012-08-10 – 2012-08-11 (×2): 1 via ORAL
  Filled 2012-08-09 (×3): qty 1

## 2012-08-09 MED ORDER — TETANUS-DIPHTH-ACELL PERTUSSIS 5-2.5-18.5 LF-MCG/0.5 IM SUSP: 0.5000 mL | Freq: Once | INTRAMUSCULAR | Status: DC

## 2012-08-09 MED ORDER — FENTANYL CITRATE 0.05 MG/ML IJ SOLN
INTRAMUSCULAR | Status: DC | PRN
  Administered 2012-08-09: 75 ug via INTRAVENOUS

## 2012-08-09 MED ORDER — OXYTOCIN 40 UNITS IN LACTATED RINGERS INFUSION - SIMPLE MED
62.5000 mL/h | INTRAVENOUS | Status: AC
  Administered 2012-08-09: 62.5 mL/h via INTRAVENOUS
  Filled 2012-08-09: qty 1000

## 2012-08-09 MED ORDER — BUPIVACAINE HCL (PF) 0.25 % IJ SOLN
INTRAMUSCULAR | Status: AC
  Filled 2012-08-09: qty 30

## 2012-08-09 MED ORDER — ZOLPIDEM TARTRATE 5 MG PO TABS: 5.0000 mg | ORAL_TABLET | Freq: Every evening | ORAL | Status: DC | PRN

## 2012-08-09 MED ORDER — NALBUPHINE HCL 10 MG/ML IJ SOLN: 5.0000 mg | INTRAMUSCULAR | Status: DC | PRN

## 2012-08-09 MED ORDER — ONDANSETRON HCL 4 MG/2ML IJ SOLN: 4.0000 mg | INTRAMUSCULAR | Status: DC | PRN

## 2012-08-09 MED ORDER — SCOPOLAMINE 1 MG/3DAYS TD PT72: 1.0000 | MEDICATED_PATCH | TRANSDERMAL | Status: DC

## 2012-08-09 MED ORDER — CEFAZOLIN SODIUM-DEXTROSE 2-3 GM-% IV SOLR
2.0000 g | INTRAVENOUS | Status: AC
  Administered 2012-08-09: 2 g via INTRAVENOUS

## 2012-08-09 MED ORDER — LACTATED RINGERS IV SOLN: INTRAVENOUS | Status: DC

## 2012-08-09 MED ORDER — KETOROLAC TROMETHAMINE 30 MG/ML IJ SOLN
INTRAMUSCULAR | Status: AC
  Administered 2012-08-09: 30 mg via INTRAVENOUS
  Filled 2012-08-09: qty 1

## 2012-08-09 MED ORDER — MORPHINE SULFATE 0.5 MG/ML IJ SOLN
INTRAMUSCULAR | Status: AC
  Filled 2012-08-09: qty 10

## 2012-08-09 MED ORDER — FENTANYL CITRATE 0.05 MG/ML IJ SOLN
INTRAMUSCULAR | Status: AC
  Filled 2012-08-09: qty 2

## 2012-08-09 MED ORDER — FENTANYL CITRATE 0.05 MG/ML IJ SOLN: 25.0000 ug | INTRAMUSCULAR | Status: DC | PRN

## 2012-08-09 MED ORDER — DIPHENHYDRAMINE HCL 25 MG PO CAPS: 25.0000 mg | ORAL_CAPSULE | Freq: Four times a day (QID) | ORAL | Status: DC | PRN

## 2012-08-09 MED ORDER — SIMETHICONE 80 MG PO CHEW
80.0000 mg | CHEWABLE_TABLET | Freq: Three times a day (TID) | ORAL | Status: DC
  Administered 2012-08-10 (×3): 80 mg via ORAL

## 2012-08-09 MED ORDER — LACTATED RINGERS IV SOLN
INTRAVENOUS | Status: DC
  Administered 2012-08-10: 06:00:00 via INTRAVENOUS

## 2012-08-09 MED ORDER — SENNOSIDES-DOCUSATE SODIUM 8.6-50 MG PO TABS
2.0000 | ORAL_TABLET | Freq: Every day | ORAL | Status: DC
  Administered 2012-08-10: 2 via ORAL

## 2012-08-09 MED ORDER — SODIUM CHLORIDE 0.9 % IJ SOLN: 3.0000 mL | INTRAMUSCULAR | Status: DC | PRN

## 2012-08-09 MED ORDER — DIPHENHYDRAMINE HCL 25 MG PO CAPS: 25.0000 mg | ORAL_CAPSULE | ORAL | Status: DC | PRN

## 2012-08-09 MED ORDER — IBUPROFEN 600 MG PO TABS
600.0000 mg | ORAL_TABLET | Freq: Four times a day (QID) | ORAL | Status: DC
  Administered 2012-08-10 – 2012-08-11 (×7): 600 mg via ORAL
  Filled 2012-08-09 (×7): qty 1

## 2012-08-09 MED ORDER — LACTATED RINGERS IV SOLN
INTRAVENOUS | Status: DC | PRN
  Administered 2012-08-09: 14:00:00 via INTRAVENOUS

## 2012-08-09 MED ORDER — MIDAZOLAM HCL 2 MG/2ML IJ SOLN: 0.5000 mg | Freq: Once | INTRAMUSCULAR | Status: DC | PRN

## 2012-08-09 MED ORDER — DIPHENHYDRAMINE HCL 50 MG/ML IJ SOLN: 12.5000 mg | INTRAMUSCULAR | Status: DC | PRN

## 2012-08-09 MED ORDER — OXYTOCIN 10 UNIT/ML IJ SOLN
INTRAMUSCULAR | Status: AC
  Filled 2012-08-09: qty 4

## 2012-08-09 MED ORDER — EPHEDRINE SULFATE 50 MG/ML IJ SOLN
INTRAMUSCULAR | Status: DC | PRN
  Administered 2012-08-09: 10 mg via INTRAVENOUS
  Administered 2012-08-09: 15 mg via INTRAVENOUS
  Administered 2012-08-09 (×2): 10 mg via INTRAVENOUS

## 2012-08-09 MED ORDER — ONDANSETRON HCL 4 MG/2ML IJ SOLN
INTRAMUSCULAR | Status: DC | PRN
  Administered 2012-08-09: 4 mg via INTRAVENOUS

## 2012-08-09 MED ORDER — LACTATED RINGERS IV SOLN
INTRAVENOUS | Status: DC
  Administered 2012-08-09 (×3): via INTRAVENOUS

## 2012-08-09 MED ORDER — SCOPOLAMINE 1 MG/3DAYS TD PT72: 1.0000 | MEDICATED_PATCH | Freq: Once | TRANSDERMAL | Status: DC

## 2012-08-09 MED ORDER — DIPHENHYDRAMINE HCL 50 MG/ML IJ SOLN: 25.0000 mg | INTRAMUSCULAR | Status: DC | PRN

## 2012-08-09 MED ORDER — ONDANSETRON HCL 4 MG/2ML IJ SOLN: 4.0000 mg | Freq: Three times a day (TID) | INTRAMUSCULAR | Status: DC | PRN

## 2012-08-09 MED ORDER — NALOXONE HCL 1 MG/ML IJ SOLN: 1.0000 ug/kg/h | INTRAMUSCULAR | Status: DC | PRN

## 2012-08-09 MED ORDER — MORPHINE SULFATE (PF) 0.5 MG/ML IJ SOLN
INTRAMUSCULAR | Status: DC | PRN
  Administered 2012-08-09: 4.9 mg via INTRAVENOUS

## 2012-08-09 MED ORDER — EPHEDRINE 5 MG/ML INJ
INTRAVENOUS | Status: AC
  Filled 2012-08-09: qty 10

## 2012-08-09 MED ORDER — NALOXONE HCL 0.4 MG/ML IJ SOLN: 0.4000 mg | INTRAMUSCULAR | Status: DC | PRN

## 2012-08-09 MED ORDER — SIMETHICONE 80 MG PO CHEW
80.0000 mg | CHEWABLE_TABLET | ORAL | Status: DC | PRN
  Administered 2012-08-10 – 2012-08-11 (×2): 80 mg via ORAL

## 2012-08-09 MED ORDER — OXYCODONE-ACETAMINOPHEN 5-325 MG PO TABS
1.0000 | ORAL_TABLET | ORAL | Status: DC | PRN
  Administered 2012-08-10 – 2012-08-11 (×4): 1 via ORAL
  Filled 2012-08-09 (×4): qty 1

## 2012-08-09 MED ORDER — MENTHOL 3 MG MT LOZG: 1.0000 | LOZENGE | OROMUCOSAL | Status: DC | PRN

## 2012-08-09 MED ORDER — MORPHINE SULFATE (PF) 0.5 MG/ML IJ SOLN
INTRAMUSCULAR | Status: DC | PRN
  Administered 2012-08-09: .1 mg via INTRATHECAL

## 2012-08-09 MED ORDER — PROMETHAZINE HCL 25 MG/ML IJ SOLN: 6.2500 mg | INTRAMUSCULAR | Status: DC | PRN

## 2012-08-09 SURGICAL SUPPLY — 37 items
ADH SKN CLS APL DERMABOND .7 (GAUZE/BANDAGES/DRESSINGS) ×1
BARRIER ADHS 3X4 INTERCEED (GAUZE/BANDAGES/DRESSINGS) IMPLANT
BRR ADH 4X3 ABS CNTRL BYND (GAUZE/BANDAGES/DRESSINGS)
CLOTH BEACON ORANGE TIMEOUT ST (SAFETY) ×2 IMPLANT
CONTAINER PREFILL 10% NBF 15ML (MISCELLANEOUS) IMPLANT
DERMABOND ADVANCED (GAUZE/BANDAGES/DRESSINGS) ×1
DERMABOND ADVANCED .7 DNX12 (GAUZE/BANDAGES/DRESSINGS) IMPLANT
DRAPE LG THREE QUARTER DISP (DRAPES) ×2 IMPLANT
DRSG OPSITE POSTOP 4X10 (GAUZE/BANDAGES/DRESSINGS) ×2 IMPLANT
DRSG OPSITE POSTOP 4X12 (GAUZE/BANDAGES/DRESSINGS) ×1 IMPLANT
DURAPREP 26ML APPLICATOR (WOUND CARE) ×2 IMPLANT
ELECT REM PT RETURN 9FT ADLT (ELECTROSURGICAL) ×2
ELECTRODE REM PT RTRN 9FT ADLT (ELECTROSURGICAL) ×1 IMPLANT
EXTRACTOR VACUUM M CUP 4 TUBE (SUCTIONS) ×1 IMPLANT
GLOVE BIO SURGEON STRL SZ 6.5 (GLOVE) ×2 IMPLANT
GLOVE BIO SURGEON STRL SZ8.5 (GLOVE) ×1 IMPLANT
GLOVE SS N UNI LF 8.5 STRL (GLOVE) ×1 IMPLANT
GOWN PREVENTION PLUS LG XLONG (DISPOSABLE) ×6 IMPLANT
KIT ABG SYR 3ML LUER SLIP (SYRINGE) ×1 IMPLANT
NDL HYPO 25X5/8 SAFETYGLIDE (NEEDLE) ×1 IMPLANT
NEEDLE HYPO 22GX1.5 SAFETY (NEEDLE) IMPLANT
NEEDLE HYPO 25X5/8 SAFETYGLIDE (NEEDLE) ×2 IMPLANT
NS IRRIG 1000ML POUR BTL (IV SOLUTION) ×2 IMPLANT
PACK C SECTION WH (CUSTOM PROCEDURE TRAY) ×2 IMPLANT
PAD OB MATERNITY 4.3X12.25 (PERSONAL CARE ITEMS) ×2 IMPLANT
SLEEVE SCD COMPRESS KNEE MED (MISCELLANEOUS) IMPLANT
STAPLER VISISTAT 35W (STAPLE) IMPLANT
SUT CHROMIC 0 CTX 36 (SUTURE) ×4 IMPLANT
SUT PLAIN 0 NONE (SUTURE) IMPLANT
SUT PLAIN 2 0 XLH (SUTURE) IMPLANT
SUT VIC AB 0 CT1 27 (SUTURE) ×6
SUT VIC AB 0 CT1 27XBRD ANBCTR (SUTURE) ×3 IMPLANT
SUT VIC AB 4-0 KS 27 (SUTURE) ×1 IMPLANT
SYR CONTROL 10ML LL (SYRINGE) IMPLANT
TOWEL OR 17X24 6PK STRL BLUE (TOWEL DISPOSABLE) ×6 IMPLANT
TRAY FOLEY CATH 14FR (SET/KITS/TRAYS/PACK) ×2 IMPLANT
WATER STERILE IRR 1000ML POUR (IV SOLUTION) ×2 IMPLANT

## 2012-08-09 NOTE — H&P (Signed)
27 year old G 1 P 0 at term. Previous abdominal myomectomy in 2012. For Primary LTCS today. PNC has been uncomplicated - see [facility]  GBBS is negative  Afebrile Vital signs stable General alert and oriented Lung CTAB Car RRR Abdomen is soft and non tender Cervix is long / closed last exam  Impression  IUP at term  Previous abdominal myomectomy  PLAN: Primary LTCS Risks reviewed with patient Consent is signed.

## 2012-08-09 NOTE — Brief Op Note (Signed)
08/09/2012  1:57 PM  PATIENT:  Kellie Dalton  27 y.o. female  PRE-OPERATIVE DIAGNOSIS: IUP at 55 w 2 days, previous myomectomy  POST-OPERATIVE DIAGNOSIS: Same  PROCEDURE:  Procedure(s) with comments: CESAREAN SECTION (N/A) - PRIMARY EDC 08/21/12  SURGEON:  Surgeon(s) and Role:    * Jeani Hawking, MD - Primary    * Leslie Andrea, MD - Assisting  PHYSICIAN ASSISTANT:   ASSISTANTS: none   ANESTHESIA:   spinal  EBL:  Total I/O In: 3000 [I.V.:3000] Out: 1000 [Urine:200; Blood:800]  BLOOD ADMINISTERED:none  DRAINS: Urinary Catheter (Foley)   LOCAL MEDICATIONS USED:  NONE  SPECIMEN:  No Specimen  DISPOSITION OF SPECIMEN:  N/A  COUNTS:  YES  TOURNIQUET:  * No tourniquets in log *  DICTATION: .Other Dictation: Dictation Number [id number]  PLAN OF CARE: Admit to inpatient   PATIENT DISPOSITION:  PACU - hemodynamically stable.   Delay start of Pharmacological VTE agent (>24hrs) due to surgical blood loss or risk of bleeding: not applicable

## 2012-08-09 NOTE — Anesthesia Procedure Notes (Signed)
Spinal  Patient location during procedure: OR Start time: 08/09/2012 1:14 PM Staffing Anesthesiologist: Angus Seller., Harrell Gave. Performed by: anesthesiologist  Preanesthetic Checklist Completed: patient identified, site marked, surgical consent, pre-op evaluation, timeout performed, IV checked, risks and benefits discussed and monitors and equipment checked Spinal Block Patient position: sitting Prep: DuraPrep Patient monitoring: heart rate, cardiac monitor, continuous pulse ox and blood pressure Approach: midline Location: L3-4 Injection technique: single-shot Needle Needle type: Sprotte  Needle gauge: 24 G Needle length: 9 cm Assessment Sensory level: T4 Additional Notes Patient identified.  Risk benefits discussed including failed block, incomplete pain control, headache, nerve damage, paralysis, blood pressure changes, nausea, vomiting, reactions to medication both toxic or allergic, and postpartum back pain.  Patient expressed understanding and wished to proceed.  All questions were answered.  Sterile technique used throughout procedure.  CSF was clear.  No parasthesia or other complications.  Please see nursing notes for vital signs.

## 2012-08-09 NOTE — Transfer of Care (Signed)
Immediate Anesthesia Transfer of Care Note  Patient: Kellie Dalton  Procedure(s) Performed: Procedure(s) with comments: CESAREAN SECTION (N/A) - PRIMARY EDC 08/21/12  Patient Location: PACU  Anesthesia Type:Spinal  Level of Consciousness: awake  Airway & Oxygen Therapy: Patient Spontanous Breathing  Post-op Assessment: Report given to PACU RN and Post -op Vital signs reviewed and stable  Post vital signs: stable  Complications: No apparent anesthesia complications

## 2012-08-09 NOTE — Anesthesia Preprocedure Evaluation (Signed)
Anesthesia Evaluation  Patient identified by MRN, date of birth, ID band Patient awake    Reviewed: Allergy & Precautions, H&P , NPO status , Patient's Chart, lab work & pertinent test results  Airway Mallampati: II      Dental no notable dental hx.    Pulmonary neg pulmonary ROS,  breath sounds clear to auscultation  Pulmonary exam normal       Cardiovascular Exercise Tolerance: Good negative cardio ROS  Rhythm:regular Rate:Normal     Neuro/Psych negative neurological ROS  negative psych ROS   GI/Hepatic negative GI ROS, Neg liver ROS,   Endo/Other  negative endocrine ROSHyperthyroidism   Renal/GU negative Renal ROS  negative genitourinary   Musculoskeletal   Abdominal Normal abdominal exam  (+)   Peds  Hematology negative hematology ROS (+)   Anesthesia Other Findings   Reproductive/Obstetrics (+) Pregnancy                           Anesthesia Physical Anesthesia Plan  ASA: II  Anesthesia Plan: Spinal   Post-op Pain Management:    Induction:   Airway Management Planned:   Additional Equipment:   Intra-op Plan:   Post-operative Plan:   Informed Consent: I have reviewed the patients History and Physical, chart, labs and discussed the procedure including the risks, benefits and alternatives for the proposed anesthesia with the patient or authorized representative who has indicated his/her understanding and acceptance.     Plan Discussed with: Anesthesiologist, CRNA and Surgeon  Anesthesia Plan Comments:         Anesthesia Quick Evaluation

## 2012-08-09 NOTE — Progress Notes (Signed)
History and physical on the chart no significant changes Will proceed with primary LTCS Consent signed.

## 2012-08-09 NOTE — Consult Note (Signed)
Neonatology Note:   Attendance at C-section:    I was asked to attend this primary C/S at term due to previous myomectomy. The mother is a G1P0 O pos, GBS neg with borderline hyperthyroidism. ROM at delivery, fluid clear. Infant vigorous with good spontaneous cry and tone. Needed only bulb suctioning. Ap 9/9. Lungs clear to ausc in DR. To CN to care of Pediatrician.   Deatra James, MD

## 2012-08-09 NOTE — Anesthesia Postprocedure Evaluation (Signed)
  Anesthesia Post Note  Patient: Kellie Dalton  Procedure(s) Performed: Procedure(s) (LRB): CESAREAN SECTION (N/A)  Anesthesia type: Spinal  Patient location: PACU  Post pain: Pain level controlled  Post assessment: Post-op Vital signs reviewed  Last Vitals:  Filed Vitals:   08/09/12 1412  BP: 116/64  Pulse: 106  Temp: 36.8 C  Resp: 18    Post vital signs: Reviewed  Level of consciousness: awake  Complications: No apparent anesthesia complications

## 2012-08-10 ENCOUNTER — Encounter (HOSPITAL_COMMUNITY): Payer: Self-pay | Admitting: Obstetrics and Gynecology

## 2012-08-10 LAB — CBC
MCH: 31.4 pg (ref 26.0–34.0)
Platelets: 165 10*3/uL (ref 150–400)
RBC: 2.96 MIL/uL — ABNORMAL LOW (ref 3.87–5.11)

## 2012-08-10 LAB — BIRTH TISSUE RECOVERY COLLECTION (PLACENTA DONATION)

## 2012-08-10 NOTE — Op Note (Signed)
Kellie Dalton, Kellie Dalton                ACCOUNT NO.:  [id number]  MEDICAL RECORD NO.:  [id number]  LOCATION:  9130                          FACILITY:  [facility]  PHYSICIAN:  Porter Nakama L. Jlynn Langille, M.D.DATE OF BIRTH:  1985/08/31  DATE OF PROCEDURE:  08/09/2012 DATE OF DISCHARGE:                              OPERATIVE REPORT   PREOPERATIVE DIAGNOSES: 1. Intrauterine pregnancy at 38 weeks and 2 days. 2. Previous abdominal myomectomy.  POSTOPERATIVE DIAGNOSES: 1. Intrauterine pregnancy at 38 weeks and 2 days. 2. Previous abdominal myomectomy.  PROCEDURE:  Primary low transverse cesarean section.  SURGEON:  Teofilo Lupinacci L. Vincente Poli, MD  ASSISTANT:  Guy Sandifer. Tomblin, MD  ANESTHESIA:  Spinal.  ESTIMATED BLOOD LOSS:  750 mL.  COMPLICATIONS:  None.  DESCRIPTION OF PROCEDURE:  The patient was taken to the operating room. Spinal was placed.  She was then prepped and draped in usual sterile fashion.  A Foley catheter had been inserted.  A low transverse incision was made at the area of the previous myomectomy scar, was carried down to the fascia.  The fascia was scored in the midline and extended laterally.  Rectus muscles were separated in the midline.  The peritoneum was entered bluntly.  The peritoneal incision was stretched. The bladder blade was inserted.  The lower uterine segment was identified and the bladder flap was created sharply and then digitally. The bladder blade was then readjusted.  A low transverse incision was made in the uterus.  The uterus was entered using a hemostat.  The amniotic fluid was clear.  The baby was in cephalic presentation and was delivered easily with 1 pull of the vacuum and no pop-off.  The baby was a female infant.  Apgars 9 at 1 minute, 9 at 5 minutes.  The cord was clamped and cut.  The baby was handed to the awaiting Neonatal Team. The uterus was subsequently removed, exteriorized.  The uterus was cleared of all clots and debris after the placenta was  manually removed and noted to be normal, intact with a three-vessel cord.  The uterine incision was closed with 0 Vicryl in a running stitch and then 0 chromic in a running stitch.  Hemostasis was excellent.  Antibiotics and Pitocin had been given.  The uterus was firm.  The uterus was returned to the abdomen.  Irrigation was performed.  The peritoneum was closed using 0 Vicryl.  The rectus muscles were closed with an 0 Vicryl in a running stitch starting at each corner, meeting in the midline.  After irrigation, the skin was closed with 4-0 Vicryl on a Keith needle.  Dermabond was applied.  All sponge, lap, and instrument counts were correct x2.  The patient went to the recovery room in stable condition.     Ronelle Smallman L. Vincente Poli, M.D.     Florestine Avers  D:  08/09/2012  T:  08/10/2012  Job:  [id number]

## 2012-08-10 NOTE — Progress Notes (Signed)
Subjective: Postpartum Day 1: Cesarean Delivery Patient reports incisional pain and tolerating PO.    Objective: Vital signs in last 24 hours: Temp:  [98.2 F (36.8 C)-98.9 F (37.2 C)] 98.2 F (36.8 C) (02/25 0230) Pulse Rate:  [80-106] 80 (02/25 0230) Resp:  [16-20] 20 (02/25 0230) BP: (104-124)/(58-75) 104/63 mmHg (02/25 0230) SpO2:  [93 %-100 %] 96 % (02/25 0030) Weight:  [170 lb (77.111 kg)] 170 lb (77.111 kg) (02/24 1141)  Physical Exam:  General: alert and cooperative Lochia: appropriate Uterine Fundus: firm Incision: honeycomb dressing CDI DVT Evaluation: No evidence of DVT seen on physical exam. Negative Homan's sign. No cords or calf tenderness. No significant calf/ankle edema.   Recent Labs  08/10/12 0500  HGB 9.3*  HCT 27.0*    Assessment/Plan: Status post Cesarean section. Doing well postoperatively.  Continue current care.  CURTIS,CAROL G 08/10/2012, 8:08 AM

## 2012-08-10 NOTE — Anesthesia Postprocedure Evaluation (Signed)
  Anesthesia Post-op Note  Patient: Kellie Dalton  Procedure(s) Performed: Procedure(s) with comments: CESAREAN SECTION (N/A) - PRIMARY EDC 08/21/12  Patient Location: PACU and Mother/Baby  Anesthesia Type:Spinal  Level of Consciousness: awake, alert  and oriented  Airway and Oxygen Therapy: Patient Spontanous Breathing  Post-op Pain: mild  Post-op Assessment: Patient's Cardiovascular Status Stable, Respiratory Function Stable, No signs of Nausea or vomiting, Adequate PO intake and Pain level controlled  Post-op Vital Signs: stable  Complications: No apparent anesthesia complications

## 2012-08-11 LAB — CBC
HCT: 26.4 % — ABNORMAL LOW (ref 36.0–46.0)
MCH: 31.8 pg (ref 26.0–34.0)
MCHC: 33.7 g/dL (ref 30.0–36.0)
MCV: 94.3 fL (ref 78.0–100.0)
Platelets: 174 10*3/uL (ref 150–400)
RDW: 14 % (ref 11.5–15.5)

## 2012-08-11 MED ORDER — OXYCODONE-ACETAMINOPHEN 5-325 MG PO TABS: 1.0000 | ORAL_TABLET | ORAL | Status: DC | PRN

## 2012-08-11 MED ORDER — IBUPROFEN 600 MG PO TABS: 600.0000 mg | ORAL_TABLET | Freq: Four times a day (QID) | ORAL | Status: DC

## 2012-08-11 NOTE — Discharge Summary (Signed)
Obstetric Discharge Summary Reason for Admission: cesarean section Prenatal Procedures: ultrasound Intrapartum Procedures: cesarean: low cervical, transverse Postpartum Procedures: none Complications-Operative and Postpartum: none Hemoglobin  Date Value Range Status  08/11/2012 8.9* 12.0 - 15.0 g/dL Final     HCT  Date Value Range Status  08/11/2012 26.4* 36.0 - 46.0 % Final    Physical Exam:  General: alert and cooperative Lochia: appropriate Uterine Fundus: firm Incision: honeycomb dressing noted CDI DVT Evaluation: No evidence of DVT seen on physical exam. Negative Homan's sign. No cords or calf tenderness. No significant calf/ankle edema.  Discharge Diagnoses: Term Pregnancy-delivered  Discharge Information: Date: 08/11/2012 Activity: pelvic rest Diet: routine Medications: PNV, Ibuprofen and Percocet Condition: stable Instructions: refer to practice specific booklet Discharge to: home   Newborn Data: Live born female  Birth Weight: 7 lb 8 oz (3402 g) APGAR: 9, 9  Home with mother.  CURTIS,CAROL G 08/11/2012, 8:25 AM

## 2013-04-06 ENCOUNTER — Other Ambulatory Visit: Payer: Self-pay | Admitting: Obstetrics and Gynecology

## 2013-04-06 DIAGNOSIS — N6001 Solitary cyst of right breast: Secondary | ICD-10-CM

## 2013-04-07 ENCOUNTER — Ambulatory Visit
Admission: RE | Admit: 2013-04-07 | Discharge: 2013-04-07 | Disposition: A | Discharge status: Home / Self Care | Payer: Commercial Indemnity | Source: Ambulatory Visit | Attending: Obstetrics and Gynecology | Admitting: Obstetrics and Gynecology

## 2013-04-07 DIAGNOSIS — N6001 Solitary cyst of right breast: Secondary | ICD-10-CM

## 2014-01-24 ENCOUNTER — Other Ambulatory Visit: Payer: Self-pay | Admitting: Obstetrics and Gynecology

## 2014-01-26 LAB — CYTOLOGY - PAP

## 2014-04-17 ENCOUNTER — Encounter (HOSPITAL_COMMUNITY): Payer: Self-pay | Admitting: Obstetrics and Gynecology

## 2016-03-04 NOTE — H&P (Signed)
30 year old female with history of myomectomy presents for TAH and Bs.  She has been having bleeding off and on all the time. She also has bloating and pelvic pain.  Past Medical History:  Diagnosis Date  . Anemia    history  . Hyperthyroidism    no meds- "borderline hyper"   Past Surgical History:  Procedure Laterality Date  . CESAREAN SECTION N/A 08/09/2012   Procedure: CESAREAN SECTION;  Surgeon: Cyril Mourning, MD;  Location: [facility];  Service: Obstetrics;  Laterality: N/A;  PRIMARY EDC 08/21/12  . COLONOSCOPY    . MYOMECTOMY  03/20/2011   Procedure: MYOMECTOMY;  Surgeon: Cyril Mourning, MD;  Location: [facility];  Service: Gynecology;  Laterality: N/A;   Allergies: NKDA No family history on file. Social History   Social History  . Marital status: Married    Spouse name: N/A  . Number of children: N/A  . Years of education: N/A   Social History Main Topics  . Smoking status: Former Smoker    Packs/day: 0.50    Years: 6.00    Types: Cigarettes    Quit date: 11/15/2011  . Smokeless tobacco: Never Used  . Alcohol use No  . Drug use: No  . Sexual activity: Yes    Birth control/ protection: None     Comment: pregnant   Other Topics Concern  . Not on file   Social History Narrative  . No narrative on file    Married No alcohol or tobacco  vss General alert and oriented Lung CTAB Car RRR Abdomen is soft and palpable fibroids 16 weeks size  IMPRESSION: Symptomatic fibroids  PLAN: TAH and BS Risks reviewed and patient consented

## 2016-03-10 NOTE — Patient Instructions (Signed)
Your procedure is scheduled on:  Monday, Oct. 2, 2017  Enter through the Main Entrance of [facility] at:  6:00 AM  Pick up the phone at the desk and dial [phone].  Call this number if you have problems the morning of surgery: [phone].  Remember: Do NOT eat food or drink after:  Midnight Sunday  Take these medicines the morning of surgery with a SIP OF WATER:  None  Stop taking Ibuprofen at this time  Do NOT wear jewelry (body piercing), metal hair clips/bobby pins, make-up, or nail polish. Do NOT wear lotions, powders, or perfumes.  You may wear deodorant. Do NOT shave for 48 hours prior to surgery. Do NOT bring valuables to the hospital. Contacts, dentures, or bridgework may not be worn into surgery.  Leave suitcase in car.  After surgery it may be brought to your room.  For patients admitted to the hospital, checkout time is 11:00 AM the day of discharge.

## 2016-03-11 ENCOUNTER — Encounter (HOSPITAL_COMMUNITY): Payer: Self-pay

## 2016-03-11 ENCOUNTER — Encounter (HOSPITAL_COMMUNITY)
Admission: RE | Admit: 2016-03-11 | Discharge: 2016-03-11 | Disposition: A | Discharge status: Home / Self Care | Payer: Managed Care, Other (non HMO) | Source: Ambulatory Visit | Attending: Obstetrics and Gynecology | Admitting: Obstetrics and Gynecology

## 2016-03-11 DIAGNOSIS — Z01818 Encounter for other preprocedural examination: Secondary | ICD-10-CM | POA: Diagnosis not present

## 2016-03-11 LAB — CBC
HCT: 34.3 % — ABNORMAL LOW (ref 36.0–46.0)
Hemoglobin: 11.5 g/dL — ABNORMAL LOW (ref 12.0–15.0)
MCH: 28.1 pg (ref 26.0–34.0)
MCHC: 33.5 g/dL (ref 30.0–36.0)
MCV: 83.9 fL (ref 78.0–100.0)
PLATELETS: 256 10*3/uL (ref 150–400)
RBC: 4.09 MIL/uL (ref 3.87–5.11)
RDW: 13.9 % (ref 11.5–15.5)
WBC: 4.3 10*3/uL (ref 4.0–10.5)

## 2016-03-17 ENCOUNTER — Inpatient Hospital Stay (HOSPITAL_COMMUNITY): Payer: Managed Care, Other (non HMO) | Admitting: Anesthesiology

## 2016-03-17 ENCOUNTER — Inpatient Hospital Stay (HOSPITAL_COMMUNITY)
Admission: RE | Admit: 2016-03-17 | Discharge: 2016-03-19 | DRG: 743 | Disposition: A | Discharge status: Home / Self Care | Payer: Managed Care, Other (non HMO) | Source: Ambulatory Visit | Attending: Obstetrics and Gynecology | Admitting: Obstetrics and Gynecology

## 2016-03-17 ENCOUNTER — Encounter (HOSPITAL_COMMUNITY): Payer: Self-pay | Admitting: Anesthesiology

## 2016-03-17 ENCOUNTER — Encounter (HOSPITAL_COMMUNITY)
Admission: RE | Disposition: A | Discharge status: Home / Self Care | Payer: Self-pay | Source: Ambulatory Visit | Attending: Obstetrics and Gynecology

## 2016-03-17 DIAGNOSIS — N801 Endometriosis of ovary: Secondary | ICD-10-CM | POA: Diagnosis present

## 2016-03-17 DIAGNOSIS — D252 Subserosal leiomyoma of uterus: Secondary | ICD-10-CM | POA: Diagnosis present

## 2016-03-17 DIAGNOSIS — Z87891 Personal history of nicotine dependence: Secondary | ICD-10-CM

## 2016-03-17 DIAGNOSIS — F419 Anxiety disorder, unspecified: Secondary | ICD-10-CM | POA: Diagnosis present

## 2016-03-17 DIAGNOSIS — D251 Intramural leiomyoma of uterus: Secondary | ICD-10-CM | POA: Diagnosis present

## 2016-03-17 DIAGNOSIS — N802 Endometriosis of fallopian tube: Secondary | ICD-10-CM | POA: Diagnosis present

## 2016-03-17 DIAGNOSIS — R102 Pelvic and perineal pain: Secondary | ICD-10-CM | POA: Diagnosis present

## 2016-03-17 DIAGNOSIS — D259 Leiomyoma of uterus, unspecified: Secondary | ICD-10-CM | POA: Diagnosis present

## 2016-03-17 DIAGNOSIS — M62838 Other muscle spasm: Secondary | ICD-10-CM | POA: Diagnosis present

## 2016-03-17 DIAGNOSIS — D219 Benign neoplasm of connective and other soft tissue, unspecified: Secondary | ICD-10-CM | POA: Diagnosis present

## 2016-03-17 HISTORY — PX: HYSTERECTOMY ABDOMINAL WITH SALPINGECTOMY: SHX6725

## 2016-03-17 LAB — HCG, SERUM, QUALITATIVE: PREG SERUM: NEGATIVE

## 2016-03-17 SURGERY — HYSTERECTOMY, TOTAL, ABDOMINAL, WITH SALPINGECTOMY
Anesthesia: Monitor Anesthesia Care | Site: Abdomen | Laterality: Bilateral

## 2016-03-17 MED ORDER — ONDANSETRON HCL 4 MG/2ML IJ SOLN
INTRAMUSCULAR | Status: DC | PRN
  Administered 2016-03-17: 4 mg via INTRAVENOUS

## 2016-03-17 MED ORDER — SCOPOLAMINE 1 MG/3DAYS TD PT72
MEDICATED_PATCH | TRANSDERMAL | Status: AC
  Administered 2016-03-17: 1.5 mg via TRANSDERMAL
  Filled 2016-03-17: qty 1

## 2016-03-17 MED ORDER — KETOROLAC TROMETHAMINE 30 MG/ML IJ SOLN
INTRAMUSCULAR | Status: AC
  Filled 2016-03-17: qty 1

## 2016-03-17 MED ORDER — MORPHINE SULFATE-NACL 0.5-0.9 MG/ML-% IV SOSY
PREFILLED_SYRINGE | INTRAVENOUS | Status: AC
Start: 2016-03-17 — End: 2016-03-17
  Filled 2016-03-17: qty 1

## 2016-03-17 MED ORDER — NEOSTIGMINE METHYLSULFATE 10 MG/10ML IV SOLN
INTRAVENOUS | Status: AC
  Filled 2016-03-17: qty 1

## 2016-03-17 MED ORDER — TRAMADOL HCL 50 MG PO TABS
50.0000 mg | ORAL_TABLET | Freq: Four times a day (QID) | ORAL | Status: DC | PRN
  Administered 2016-03-17: 50 mg via ORAL
  Filled 2016-03-17: qty 1

## 2016-03-17 MED ORDER — MORPHINE SULFATE (PF) 0.5 MG/ML IJ SOLN
INTRAMUSCULAR | Status: DC | PRN
  Administered 2016-03-17: .2 mg via INTRATHECAL

## 2016-03-17 MED ORDER — MENTHOL 3 MG MT LOZG: 1.0000 | LOZENGE | OROMUCOSAL | Status: DC | PRN

## 2016-03-17 MED ORDER — SCOPOLAMINE 1 MG/3DAYS TD PT72
1.0000 | MEDICATED_PATCH | Freq: Once | TRANSDERMAL | Status: DC
  Filled 2016-03-17: qty 1

## 2016-03-17 MED ORDER — LIDOCAINE HCL (CARDIAC) 20 MG/ML IV SOLN
INTRAVENOUS | Status: DC | PRN
  Administered 2016-03-17: 80 mg via INTRAVENOUS

## 2016-03-17 MED ORDER — MIDAZOLAM HCL 2 MG/2ML IJ SOLN
INTRAMUSCULAR | Status: DC | PRN
  Administered 2016-03-17 (×2): 1 mg via INTRAVENOUS

## 2016-03-17 MED ORDER — PROPOFOL 10 MG/ML IV BOLUS
INTRAVENOUS | Status: AC
  Filled 2016-03-17: qty 20

## 2016-03-17 MED ORDER — LISDEXAMFETAMINE DIMESYLATE 50 MG PO CAPS: 50.0000 mg | ORAL_CAPSULE | Freq: Every day | ORAL | Status: DC

## 2016-03-17 MED ORDER — MIDAZOLAM HCL 2 MG/2ML IJ SOLN
INTRAMUSCULAR | Status: AC
  Filled 2016-03-17: qty 2

## 2016-03-17 MED ORDER — KETOROLAC TROMETHAMINE 30 MG/ML IJ SOLN: 30.0000 mg | Freq: Once | INTRAMUSCULAR | Status: DC

## 2016-03-17 MED ORDER — DIPHENHYDRAMINE HCL 50 MG/ML IJ SOLN: 12.5000 mg | INTRAMUSCULAR | Status: DC | PRN

## 2016-03-17 MED ORDER — ACETAMINOPHEN 500 MG PO TABS
1000.0000 mg | ORAL_TABLET | Freq: Four times a day (QID) | ORAL | Status: AC
  Administered 2016-03-17 – 2016-03-18 (×3): 1000 mg via ORAL
  Filled 2016-03-17 (×4): qty 2

## 2016-03-17 MED ORDER — FENTANYL CITRATE (PF) 100 MCG/2ML IJ SOLN
INTRAMUSCULAR | Status: AC
  Filled 2016-03-17: qty 2

## 2016-03-17 MED ORDER — ONDANSETRON HCL 4 MG/2ML IJ SOLN
4.0000 mg | Freq: Three times a day (TID) | INTRAMUSCULAR | Status: DC | PRN
  Administered 2016-03-17 – 2016-03-18 (×2): 4 mg via INTRAVENOUS
  Filled 2016-03-17 (×2): qty 2

## 2016-03-17 MED ORDER — HYDROMORPHONE HCL 1 MG/ML IJ SOLN: 0.2500 mg | INTRAMUSCULAR | Status: DC | PRN

## 2016-03-17 MED ORDER — SCOPOLAMINE 1 MG/3DAYS TD PT72
1.0000 | MEDICATED_PATCH | Freq: Once | TRANSDERMAL | Status: DC
  Administered 2016-03-17: 1.5 mg via TRANSDERMAL

## 2016-03-17 MED ORDER — DIAZEPAM 2 MG PO TABS: 2.0000 mg | ORAL_TABLET | Freq: Four times a day (QID) | ORAL | Status: DC | PRN

## 2016-03-17 MED ORDER — KETOROLAC TROMETHAMINE 30 MG/ML IJ SOLN: 30.0000 mg | Freq: Four times a day (QID) | INTRAMUSCULAR | Status: DC | PRN

## 2016-03-17 MED ORDER — 0.9 % SODIUM CHLORIDE (POUR BTL) OPTIME
TOPICAL | Status: DC | PRN
  Administered 2016-03-17: 1000 mL

## 2016-03-17 MED ORDER — LACTATED RINGERS IV SOLN
INTRAVENOUS | Status: DC
  Administered 2016-03-17: 08:00:00 via INTRAVENOUS
  Administered 2016-03-17: 1000 mL via INTRAVENOUS
  Administered 2016-03-17: 06:00:00 via INTRAVENOUS

## 2016-03-17 MED ORDER — FENTANYL CITRATE (PF) 100 MCG/2ML IJ SOLN
INTRAMUSCULAR | Status: DC | PRN
  Administered 2016-03-17: 20 ug via INTRATHECAL
  Administered 2016-03-17: 30 ug via INTRAVENOUS
  Administered 2016-03-17 (×2): 50 ug via INTRAVENOUS

## 2016-03-17 MED ORDER — NALOXONE HCL 0.4 MG/ML IJ SOLN: 0.4000 mg | INTRAMUSCULAR | Status: DC | PRN

## 2016-03-17 MED ORDER — ONDANSETRON HCL 4 MG/2ML IJ SOLN
INTRAMUSCULAR | Status: AC
Start: 2016-03-17 — End: 2016-03-17
  Filled 2016-03-17: qty 2

## 2016-03-17 MED ORDER — NALBUPHINE HCL 10 MG/ML IJ SOLN: 5.0000 mg | INTRAMUSCULAR | Status: DC | PRN

## 2016-03-17 MED ORDER — KETOROLAC TROMETHAMINE 30 MG/ML IJ SOLN
30.0000 mg | Freq: Once | INTRAMUSCULAR | Status: AC
  Administered 2016-03-17: 30 mg via INTRAVENOUS

## 2016-03-17 MED ORDER — NALBUPHINE HCL 10 MG/ML IJ SOLN: 5.0000 mg | Freq: Once | INTRAMUSCULAR | Status: DC | PRN

## 2016-03-17 MED ORDER — DIPHENHYDRAMINE HCL 25 MG PO CAPS: 25.0000 mg | ORAL_CAPSULE | ORAL | Status: DC | PRN

## 2016-03-17 MED ORDER — NALOXONE HCL 2 MG/2ML IJ SOSY
1.0000 ug/kg/h | PREFILLED_SYRINGE | INTRAVENOUS | Status: DC | PRN
  Filled 2016-03-17: qty 2

## 2016-03-17 MED ORDER — DEXAMETHASONE SODIUM PHOSPHATE 4 MG/ML IJ SOLN
INTRAMUSCULAR | Status: DC | PRN
  Administered 2016-03-17: 4 mg via INTRAVENOUS

## 2016-03-17 MED ORDER — CEFAZOLIN SODIUM-DEXTROSE 2-4 GM/100ML-% IV SOLN
2.0000 g | INTRAVENOUS | Status: AC
  Administered 2016-03-17: 2 g via INTRAVENOUS

## 2016-03-17 MED ORDER — DEXAMETHASONE SODIUM PHOSPHATE 10 MG/ML IJ SOLN
INTRAMUSCULAR | Status: AC
  Filled 2016-03-17: qty 1

## 2016-03-17 MED ORDER — MEPERIDINE HCL 25 MG/ML IJ SOLN
6.2500 mg | INTRAMUSCULAR | Status: DC | PRN
Start: 2016-03-17 — End: 2016-03-17

## 2016-03-17 MED ORDER — SODIUM CHLORIDE 0.9% FLUSH: 3.0000 mL | INTRAVENOUS | Status: DC | PRN

## 2016-03-17 MED ORDER — FENTANYL CITRATE (PF) 250 MCG/5ML IJ SOLN
INTRAMUSCULAR | Status: AC
Start: 2016-03-17 — End: 2016-03-17
  Filled 2016-03-17: qty 5

## 2016-03-17 MED ORDER — BUPIVACAINE IN DEXTROSE 0.75-8.25 % IT SOLN
INTRATHECAL | Status: DC | PRN
  Administered 2016-03-17: 2 mL via INTRATHECAL

## 2016-03-17 MED ORDER — IBUPROFEN 600 MG PO TABS
600.0000 mg | ORAL_TABLET | Freq: Four times a day (QID) | ORAL | Status: DC | PRN
Start: 2016-03-17 — End: 2016-03-19
  Administered 2016-03-18 – 2016-03-19 (×4): 600 mg via ORAL
  Filled 2016-03-17 (×4): qty 1

## 2016-03-17 MED ORDER — ONDANSETRON HCL 4 MG/2ML IJ SOLN: 4.0000 mg | Freq: Once | INTRAMUSCULAR | Status: DC | PRN

## 2016-03-17 MED ORDER — LIDOCAINE HCL (CARDIAC) 20 MG/ML IV SOLN
INTRAVENOUS | Status: AC
  Filled 2016-03-17: qty 5

## 2016-03-17 MED ORDER — LACTATED RINGERS IV SOLN
INTRAVENOUS | Status: DC
  Administered 2016-03-17: 16:00:00 via INTRAVENOUS
  Administered 2016-03-17: 125 mL/h via INTRAVENOUS

## 2016-03-17 MED ORDER — ROCURONIUM BROMIDE 100 MG/10ML IV SOLN
INTRAVENOUS | Status: AC
  Filled 2016-03-17: qty 1

## 2016-03-17 MED ORDER — OXYCODONE HCL 5 MG/5ML PO SOLN: 5.0000 mg | Freq: Once | ORAL | Status: DC | PRN

## 2016-03-17 MED ORDER — IBUPROFEN 600 MG PO TABS: 600.0000 mg | ORAL_TABLET | Freq: Four times a day (QID) | ORAL | Status: DC | PRN

## 2016-03-17 MED ORDER — BUPIVACAINE HCL (PF) 0.25 % IJ SOLN
INTRAMUSCULAR | Status: AC
  Filled 2016-03-17: qty 30

## 2016-03-17 MED ORDER — OXYCODONE-ACETAMINOPHEN 5-325 MG PO TABS
1.0000 | ORAL_TABLET | ORAL | Status: DC | PRN
  Administered 2016-03-17 – 2016-03-19 (×7): 1 via ORAL
  Filled 2016-03-17 (×7): qty 1

## 2016-03-17 MED ORDER — PROPOFOL 500 MG/50ML IV EMUL
INTRAVENOUS | Status: DC | PRN
  Administered 2016-03-17: 25 ug/kg/min via INTRAVENOUS

## 2016-03-17 MED ORDER — OXYCODONE HCL 5 MG PO TABS: 5.0000 mg | ORAL_TABLET | Freq: Once | ORAL | Status: DC | PRN

## 2016-03-17 SURGICAL SUPPLY — 40 items
BRR ADH 6X5 SEPRAFILM 1 SHT (MISCELLANEOUS)
CANISTER SUCT 3000ML (MISCELLANEOUS) ×3 IMPLANT
CLOTH BEACON ORANGE TIMEOUT ST (SAFETY) ×3 IMPLANT
DECANTER SPIKE VIAL GLASS SM (MISCELLANEOUS) IMPLANT
DRAPE CESAREAN BIRTH W POUCH (DRAPES) ×3 IMPLANT
DRAPE WARM FLUID 44X44 (DRAPE) IMPLANT
DRSG OPSITE POSTOP 4X10 (GAUZE/BANDAGES/DRESSINGS) ×3 IMPLANT
DURAPREP 26ML APPLICATOR (WOUND CARE) ×3 IMPLANT
GAUZE SPONGE 4X4 16PLY XRAY LF (GAUZE/BANDAGES/DRESSINGS) IMPLANT
GLOVE BIO SURGEON STRL SZ 6.5 (GLOVE) ×2 IMPLANT
GLOVE BIO SURGEONS STRL SZ 6.5 (GLOVE) ×1
GLOVE BIOGEL PI IND STRL 7.0 (GLOVE) ×4 IMPLANT
GLOVE BIOGEL PI INDICATOR 7.0 (GLOVE) ×8
GOWN STRL REUS W/TWL LRG LVL3 (GOWN DISPOSABLE) ×9 IMPLANT
LIQUID BAND (GAUZE/BANDAGES/DRESSINGS) ×3 IMPLANT
NEEDLE HYPO 22GX1.5 SAFETY (NEEDLE) ×3 IMPLANT
NS IRRIG 1000ML POUR BTL (IV SOLUTION) ×3 IMPLANT
PACK ABDOMINAL GYN (CUSTOM PROCEDURE TRAY) ×3 IMPLANT
PAD OB MATERNITY 4.3X12.25 (PERSONAL CARE ITEMS) ×3 IMPLANT
PENCIL SMOKE EVAC W/HOLSTER (ELECTROSURGICAL) ×3 IMPLANT
PROTECTOR NERVE ULNAR (MISCELLANEOUS) ×4 IMPLANT
SEPRAFILM MEMBRANE 5X6 (MISCELLANEOUS) IMPLANT
SPONGE LAP 18X18 X RAY DECT (DISPOSABLE) ×4 IMPLANT
STAPLER VISISTAT 35W (STAPLE) IMPLANT
SUT PDS AB 0 CT 36 (SUTURE) IMPLANT
SUT PDS AB 0 CTX 60 (SUTURE) IMPLANT
SUT PLAIN 2 0 XLH (SUTURE) IMPLANT
SUT VIC AB 0 CT1 18XCR BRD8 (SUTURE) ×2 IMPLANT
SUT VIC AB 0 CT1 27 (SUTURE) ×12
SUT VIC AB 0 CT1 27XBRD ANBCTR (SUTURE) ×4 IMPLANT
SUT VIC AB 0 CT1 8-18 (SUTURE) ×9
SUT VIC AB 3-0 PS1 18 (SUTURE)
SUT VIC AB 3-0 PS1 18X BRD (SUTURE) IMPLANT
SUT VIC AB 4-0 KS 27 (SUTURE) ×3 IMPLANT
SUT VICRYL 0 TIES 12 18 (SUTURE) ×3 IMPLANT
SYR CONTROL 10ML LL (SYRINGE) IMPLANT
SYRINGE 10CC LL (SYRINGE) ×3 IMPLANT
TOWEL OR 17X24 6PK STRL BLUE (TOWEL DISPOSABLE) ×6 IMPLANT
TRAY FOLEY CATH SILVER 14FR (SET/KITS/TRAYS/PACK) ×3 IMPLANT
WATER STERILE IRR 1000ML POUR (IV SOLUTION) ×3 IMPLANT

## 2016-03-17 NOTE — Anesthesia Postprocedure Evaluation (Signed)
Anesthesia Post Note  Patient: Kellie Dalton  Procedure(s) Performed: Procedure(s) (LRB): HYSTERECTOMY ABDOMINAL WITH SALPINGECTOMY (Bilateral)  Patient location during evaluation: PACU Anesthesia Type: Spinal Level of consciousness: awake Pain management: pain level controlled Vital Signs Assessment: post-procedure vital signs reviewed and stable Respiratory status: spontaneous breathing Cardiovascular status: stable Postop Assessment: no headache, no backache, spinal receding, patient able to bend at knees and no signs of nausea or vomiting Anesthetic complications: no     Last Vitals:  Vitals:   03/17/16 0641 03/17/16 0858  BP: (!) 135/93   Pulse: 83   Resp: 16 15  Temp: 36.8 C 36.8 C    Last Pain:  Vitals:   03/17/16 0641  TempSrc: Oral   Pain Goal: Patients Stated Pain Goal: 3 (03/17/16 0641)               Edyn Qazi JR,JOHN Mateo Flow

## 2016-03-17 NOTE — Anesthesia Preprocedure Evaluation (Signed)
Anesthesia Evaluation  Patient identified by MRN, date of birth, ID band Patient awake    Reviewed: Allergy & Precautions, NPO status , Patient's Chart, lab work & pertinent test results  Airway Mallampati: II  TM Distance: >3 FB Neck ROM: Full    Dental  (+) Teeth Intact, Dental Advisory Given   Pulmonary former smoker,    breath sounds clear to auscultation       Cardiovascular  Rhythm:Regular Rate:Normal     Neuro/Psych    GI/Hepatic   Endo/Other    Renal/GU      Musculoskeletal   Abdominal   Peds  Hematology   Anesthesia Other Findings   Reproductive/Obstetrics                             Anesthesia Physical Anesthesia Plan  ASA: II  Anesthesia Plan: MAC and Spinal   Post-op Pain Management:    Induction:   Airway Management Planned: Natural Airway and Nasal Cannula  Additional Equipment:   Intra-op Plan:   Post-operative Plan:   Informed Consent: I have reviewed the patients History and Physical, chart, labs and discussed the procedure including the risks, benefits and alternatives for the proposed anesthesia with the patient or authorized representative who has indicated his/her understanding and acceptance.   Dental advisory given  Plan Discussed with: CRNA and Anesthesiologist  Anesthesia Plan Comments:         Anesthesia Quick Evaluation

## 2016-03-17 NOTE — Brief Op Note (Signed)
03/17/2016  8:50 AM  PATIENT:  Kellie Dalton  30 y.o. female  PRE-OPERATIVE DIAGNOSIS: Symptomatic Fibroids  POST-OPERATIVE DIAGNOSIS:   Same Endometriosis of right fallopian tube  And on surface of left ovary  PROCEDURE:  Procedure(s): HYSTERECTOMY ABDOMINAL WITH SALPINGECTOMY (Bilateral)  SURGEON:  Surgeon(s) and Role:    * Dian Queen, MD - Primary    * Louretta Shorten, MD - Assisting  PHYSICIAN ASSISTANT:   ASSISTANTS: none   ANESTHESIA:   spinal  EBL:  Total I/O In: 1000 [I.V.:1000] Out: 500 [Urine:300; Blood:200]  BLOOD ADMINISTERED:none  DRAINS: Urinary Catheter (Foley)   LOCAL MEDICATIONS USED:  NONE  SPECIMEN:  Source of Specimen:  uterus, cervix and tubes  DISPOSITION OF SPECIMEN:  PATHOLOGY  COUNTS:  YES  TOURNIQUET:  * No tourniquets in log *  DICTATION: .Other Dictation: Dictation Number [id number]  PLAN OF CARE: Admit to inpatient   PATIENT DISPOSITION:  PACU - hemodynamically stable.   Delay start of Pharmacological VTE agent (>24hrs) due to surgical blood loss or risk of bleeding: not applicable

## 2016-03-17 NOTE — Addendum Note (Signed)
Addendum  created 03/17/16 1618 by Raenette Rover, CRNA   Sign clinical note

## 2016-03-17 NOTE — Transfer of Care (Signed)
Immediate Anesthesia Transfer of Care Note  Patient: Kellie Dalton  Procedure(s) Performed: Procedure(s): HYSTERECTOMY ABDOMINAL WITH SALPINGECTOMY (Bilateral)  Patient Location: PACU  Anesthesia Type:Spinal and MAC combined with regional for post-op pain  Level of Consciousness: awake, alert  and oriented  Airway & Oxygen Therapy: Patient Spontanous Breathing  Post-op Assessment: Report given to RN and Post -op Vital signs reviewed and stable  Post vital signs: Reviewed and stable  Last Vitals:  Vitals:   03/17/16 0641  BP: (!) 135/93  Pulse: 83  Resp: 16  Temp: 36.8 C    Last Pain:  Vitals:   03/17/16 0641  TempSrc: Oral      Patients Stated Pain Goal: 3 ([id number])  Complications: No apparent anesthesia complications

## 2016-03-17 NOTE — OR Nursing (Signed)
WEIGHT OF SPECIMEN =135 GRAMS

## 2016-03-17 NOTE — Anesthesia Procedure Notes (Signed)
Spinal  Patient location during procedure: OR Start time: 03/17/2016 7:35 AM End time: 03/17/2016 7:38 AM Staffing Anesthesiologist: Lyn Hollingshead Performed: anesthesiologist  Preanesthetic Checklist Completed: patient identified, surgical consent, pre-op evaluation, timeout performed, IV checked, risks and benefits discussed and monitors and equipment checked Spinal Block Patient position: sitting Prep: site prepped and draped and DuraPrep Patient monitoring: heart rate, cardiac monitor, continuous pulse ox and blood pressure Approach: midline Location: L3-4 Injection technique: single-shot Needle Needle type: Sprotte  Needle gauge: 24 G Needle length: 9 cm Needle insertion depth: 4 cm Assessment Sensory level: T4

## 2016-03-17 NOTE — Progress Notes (Signed)
No complaints. H and p on the chart Will proceed with TAH and BS Consent signed

## 2016-03-17 NOTE — Progress Notes (Signed)
Patient is reporting a lot of pain. BP 115/80 (BP Location: Right Arm)   Pulse 70   Temp 98 F (36.7 C) (Oral)   Resp 16   LMP 02/17/2016 (Exact Date)   SpO2 97%  Urine output great  Clear Abdomen is soft and non tender Incision is clean and dry  Impression: POD #0 Valium prn anxiety and muscle spasm Ibuprofen prn Ambulate later today Routine post op care discussed with patient

## 2016-03-17 NOTE — Anesthesia Postprocedure Evaluation (Signed)
Anesthesia Post Note  Patient: Sherissa R Cronce  Procedure(s) Performed: Procedure(s) (LRB): HYSTERECTOMY ABDOMINAL WITH SALPINGECTOMY (Bilateral)  Patient location during evaluation: [facility] Anesthesia Type: Spinal Level of consciousness: awake, awake and alert, oriented and patient cooperative Pain management: pain level controlled Vital Signs Assessment: post-procedure vital signs reviewed and stable Respiratory status: spontaneous breathing, nonlabored ventilation and respiratory function stable Cardiovascular status: stable Postop Assessment: no headache, no backache, patient able to bend at knees and no signs of nausea or vomiting Anesthetic complications: no     Last Vitals:  Vitals:   03/17/16 1146 03/17/16 1550  BP: 115/80 111/70  Pulse: 70 (!) 59  Resp: 16 16  Temp: 36.7 C 36.6 C    Last Pain:  Vitals:   03/17/16 1550  TempSrc: Oral  PainSc:    Pain Goal: Patients Stated Pain Goal: 3 (03/17/16 1015)               Chip Canepa L

## 2016-03-18 LAB — CBC
HEMATOCRIT: 26.6 % — AB (ref 36.0–46.0)
HEMOGLOBIN: 8.9 g/dL — AB (ref 12.0–15.0)
MCH: 29 pg (ref 26.0–34.0)
MCHC: 33.5 g/dL (ref 30.0–36.0)
MCV: 86.6 fL (ref 78.0–100.0)
Platelets: 183 10*3/uL (ref 150–400)
RBC: 3.07 MIL/uL — ABNORMAL LOW (ref 3.87–5.11)
RDW: 14.3 % (ref 11.5–15.5)
WBC: 6.1 10*3/uL (ref 4.0–10.5)

## 2016-03-18 NOTE — Progress Notes (Signed)
Patient doing well. Pain 3/10.  BP 111/64 (BP Location: Right Arm)   Pulse (!) 59   Temp 98.6 F (37 C) (Oral)   Resp 14   Ht 5\' 4"  (1.626 m)   Wt 51.7 kg (114 lb)   LMP 02/17/2016 (Exact Date)   SpO2 100%   BMI 19.57 kg/m  Results for orders placed or performed during the hospital encounter of 03/17/16 (from the past 24 hour(s))  CBC     Status: Abnormal   Collection Time: 03/18/16  6:25 AM  Result Value Ref Range   WBC 6.1 4.0 - 10.5 K/uL   RBC 3.07 (L) 3.87 - 5.11 MIL/uL   Hemoglobin 8.9 (L) 12.0 - 15.0 g/dL   HCT 26.6 (L) 36.0 - 46.0 %   MCV 86.6 78.0 - 100.0 fL   MCH 29.0 26.0 - 34.0 pg   MCHC 33.5 30.0 - 36.0 g/dL   RDW 14.3 11.5 - 15.5 %   Platelets 183 150 - 400 K/uL   Abdomen is soft and flat and non tender Bandage clean and dry  IMPRESSION: POD #1 Doing well Remove Foley Ambulate Advance diet Possible discharge later this afternoon or tomorrow am

## 2016-03-18 NOTE — Op Note (Signed)
NAMEPARTHENIA, Kellie Dalton                ACCOUNT NO.:  [id number]  MEDICAL RECORD NO.:  [id number]  LOCATION:  9320                          FACILITY:  [facility]  PHYSICIAN:  Marilene Vath L. Demika Langenderfer, M.D.DATE OF BIRTH:  11-04-1985  DATE OF PROCEDURE:  03/17/2016 DATE OF DISCHARGE:                              OPERATIVE REPORT   PREOPERATIVE DIAGNOSIS:  Symptomatic fibroids.  PREOPERATIVE DIAGNOSES:  Symptomatic fibroids and endometriosis on the right fallopian tube and in left on the surface of left ovary.  SURGEON:  Margarette Vannatter L. Helane Rima, MD.  ASSISTANTMonia Sabal. Corinna Capra, MD.  PROCEDURE:  Total abdominal hysterectomy and bilateral salpingectomy.  EBL:  200 mL.  COMPLICATIONS:  None.  DRAINS:  Foley catheter.  PATHOLOGY:  Uterus, cervix, and fallopian tubes.  DESCRIPTION OF PROCEDURE:  The patient was taken to the operating room. Her spinal was placed.  She was then prepped and draped in usual sterile fashion.  A Foley catheter was inserted.  A low transverse incision was made, carried down to the fascia, fascia was scored in midline.  The peritoneum was entered bluntly.  The peritoneal incision was then stretched.  A self-retaining retractor was gently placed in the abdomen. We then packed the large and small bowel in upper abdomen.  She had an enlarged myomatous uterus.  There was some endometriosis on the left fallopian tube and there was some endometriosis on the surface of the left ovary that was treated with cautery.  We then placed curved Heaney clamps, clamped the triple pedicle on either side.  Each pedicle was clamped, cut, and suture ligated using 0 Vicryl suture.  We developed a bladder flap easily with sharp dissection and clamped the uterine artery at the level of the internal os with curved Heaney clamps.  Each pedicle was clamped, cut, and suture ligated using 0 Vicryl suture.  We then used a straight clamp, making sure the bladder was pushed down well below our clamps which  was easily done.  Each pedicle was clamped, cut, and suture ligated using 0 Vicryl suture.  We then entered the vagina using curved Heaney clamps.  The specimen was removed.  The remainder of the vaginal cuff and the corners were then suture ligated with 0 Vicryl suture.  After the cuff was completely closed, we went back up and used curved Heaney clamps across mesosalpinx to remove each fallopian tube and pedicles were suture ligated using 0 Vicryl as well.  Irrigation was performed.  Hemostasis was very good at all sites.  All instruments and laparotomy pads were removed from the abdominal cavity.  The peritoneum was closed using 0 Vicryl.  The fascia was closed using 0 Vicryl.  The skin was closed with a 4-0 Vicryl on a Keith needle.  Benzoin, Steri-Strips, and a honeycomb dressing were placed.  All sponge, lap, and instrument counts were correct x2.  The patient went to recovery room in stable condition.     Brent Taillon L. Helane Rima, M.D.     Nevin Bloodgood  D:  03/17/2016  T:  03/18/2016  Job:  [id number]

## 2016-03-19 ENCOUNTER — Encounter (HOSPITAL_COMMUNITY): Payer: Self-pay | Admitting: Obstetrics and Gynecology

## 2016-03-19 MED ORDER — IBUPROFEN 600 MG PO TABS: 600.0000 mg | ORAL_TABLET | Freq: Four times a day (QID) | ORAL | 0 refills | Status: AC | PRN

## 2016-03-19 MED ORDER — OXYCODONE-ACETAMINOPHEN 5-325 MG PO TABS: 1.0000 | ORAL_TABLET | ORAL | 0 refills | Status: AC | PRN

## 2016-03-19 NOTE — Progress Notes (Signed)
Pt discharged to home with husband.  Condition stable.  Pt to car via wheelchair with D. Ganey-Lloyd, NT.  No equipment for home ordered at discharge.

## 2016-03-19 NOTE — Discharge Summary (Signed)
Admission Diagnosis: Symptomatic Fibroids  Discharge Diagnosis: Same  Hospital Course: 30 year old female with fibroids. Admitted for TAH and BS. She did very well post op. By POD #2, day of discharge, she was ambulating, voiding and had good pain control. She tolerated a regular diet.  BP 104/65 (BP Location: Left Arm)   Pulse 69   Temp 98.8 F (37.1 C) (Oral)   Resp 18   Ht 5\' 4"  (1.626 m)   Wt 51.7 kg (114 lb)   LMP 02/17/2016 (Exact Date)   SpO2 97%   BMI 19.57 kg/m  No results found for this or any previous visit (from the past 24 hour(s)). Abdomen is soft and non tender Bandage is clean and dry  Patient was discharged home  Rx Percocet and Ibuprofen  Follow up in 1 week  Discharge instructions given
